# Patient Record
Sex: Female | Born: 1980 | Race: White | Hispanic: No | Marital: Married | Smoking: Never smoker
Health system: Southern US, Community
[De-identification: ages and names within clinical notes are randomized; demographics above are authoritative.]

## PROBLEM LIST (undated history)

## (undated) DIAGNOSIS — R519 Headache, unspecified: Secondary | ICD-10-CM

## (undated) DIAGNOSIS — Z8619 Personal history of other infectious and parasitic diseases: Secondary | ICD-10-CM

## (undated) DIAGNOSIS — D649 Anemia, unspecified: Secondary | ICD-10-CM

## (undated) DIAGNOSIS — R51 Headache: Secondary | ICD-10-CM

## (undated) DIAGNOSIS — F329 Major depressive disorder, single episode, unspecified: Secondary | ICD-10-CM

## (undated) DIAGNOSIS — F32A Depression, unspecified: Secondary | ICD-10-CM

## (undated) DIAGNOSIS — F419 Anxiety disorder, unspecified: Secondary | ICD-10-CM

## (undated) HISTORY — DX: Headache: R51

## (undated) HISTORY — DX: Headache, unspecified: R51.9

## (undated) HISTORY — DX: Anxiety disorder, unspecified: F41.9

## (undated) HISTORY — DX: Personal history of other infectious and parasitic diseases: Z86.19

## (undated) HISTORY — DX: Depression, unspecified: F32.A

## (undated) HISTORY — DX: Major depressive disorder, single episode, unspecified: F32.9

---

## 1999-02-27 ENCOUNTER — Ambulatory Visit (HOSPITAL_COMMUNITY): Admission: RE | Admit: 1999-02-27 | Discharge: 1999-02-27 | Payer: Self-pay | Admitting: Psychiatry

## 2010-04-03 ENCOUNTER — Emergency Department (HOSPITAL_COMMUNITY): Admission: EM | Admit: 2010-04-03 | Discharge: 2010-04-03 | Payer: Self-pay | Admitting: Emergency Medicine

## 2010-11-30 LAB — POCT PREGNANCY, URINE: Preg Test, Ur: NEGATIVE

## 2014-10-25 LAB — OB RESULTS CONSOLE ABO/RH: RH TYPE: NEGATIVE

## 2014-10-25 LAB — OB RESULTS CONSOLE RPR: RPR: NONREACTIVE

## 2014-10-25 LAB — OB RESULTS CONSOLE RUBELLA ANTIBODY, IGM: Rubella: NON-IMMUNE/NOT IMMUNE

## 2014-10-25 LAB — OB RESULTS CONSOLE HIV ANTIBODY (ROUTINE TESTING): HIV: NONREACTIVE

## 2014-10-25 LAB — OB RESULTS CONSOLE ANTIBODY SCREEN: ANTIBODY SCREEN: NEGATIVE

## 2014-10-25 LAB — OB RESULTS CONSOLE HEPATITIS B SURFACE ANTIGEN: HEP B S AG: NEGATIVE

## 2014-10-25 LAB — OB RESULTS CONSOLE GC/CHLAMYDIA
CHLAMYDIA, DNA PROBE: NEGATIVE
Gonorrhea: NEGATIVE

## 2015-03-16 ENCOUNTER — Telehealth: Payer: Self-pay | Admitting: Oncology

## 2015-03-16 NOTE — Telephone Encounter (Signed)
NEW PATIENT APPT-S/W PATIENT AND GAVE NP APPT FOR 07/19 @ 1:45 W/DR. SHADAD REFERRING DR. Aundra MilletMEGAN Fox DX-SYMPTOMATIC ANEMIA IN PREGNANCY

## 2015-04-03 ENCOUNTER — Ambulatory Visit: Payer: Self-pay | Admitting: Oncology

## 2015-04-03 ENCOUNTER — Ambulatory Visit: Payer: Self-pay

## 2015-05-01 LAB — OB RESULTS CONSOLE GBS: GBS: POSITIVE

## 2015-05-14 ENCOUNTER — Telehealth (HOSPITAL_COMMUNITY): Payer: Self-pay | Admitting: *Deleted

## 2015-05-14 ENCOUNTER — Encounter (HOSPITAL_COMMUNITY): Payer: Self-pay | Admitting: *Deleted

## 2015-05-14 NOTE — Telephone Encounter (Signed)
Preadmission screen  

## 2015-05-15 ENCOUNTER — Encounter (HOSPITAL_COMMUNITY): Payer: Self-pay | Admitting: *Deleted

## 2015-05-15 ENCOUNTER — Inpatient Hospital Stay (HOSPITAL_COMMUNITY)
Admission: AD | Admit: 2015-05-15 | Discharge: 2015-05-19 | DRG: 766 | Disposition: A | Discharge status: Home / Self Care | Payer: 59 | Source: Ambulatory Visit | Attending: Obstetrics and Gynecology | Admitting: Obstetrics and Gynecology

## 2015-05-15 DIAGNOSIS — O1493 Unspecified pre-eclampsia, third trimester: Secondary | ICD-10-CM | POA: Diagnosis present

## 2015-05-15 DIAGNOSIS — O09523 Supervision of elderly multigravida, third trimester: Secondary | ICD-10-CM | POA: Diagnosis not present

## 2015-05-15 DIAGNOSIS — Z8249 Family history of ischemic heart disease and other diseases of the circulatory system: Secondary | ICD-10-CM | POA: Diagnosis not present

## 2015-05-15 DIAGNOSIS — E669 Obesity, unspecified: Secondary | ICD-10-CM | POA: Diagnosis present

## 2015-05-15 DIAGNOSIS — O99214 Obesity complicating childbirth: Secondary | ICD-10-CM | POA: Diagnosis present

## 2015-05-15 DIAGNOSIS — Z3A37 37 weeks gestation of pregnancy: Secondary | ICD-10-CM | POA: Diagnosis present

## 2015-05-15 DIAGNOSIS — O99824 Streptococcus B carrier state complicating childbirth: Secondary | ICD-10-CM | POA: Diagnosis present

## 2015-05-15 DIAGNOSIS — O133 Gestational [pregnancy-induced] hypertension without significant proteinuria, third trimester: Secondary | ICD-10-CM | POA: Diagnosis present

## 2015-05-15 DIAGNOSIS — Z6836 Body mass index (BMI) 36.0-36.9, adult: Secondary | ICD-10-CM

## 2015-05-15 HISTORY — DX: Anemia, unspecified: D64.9

## 2015-05-15 LAB — COMPREHENSIVE METABOLIC PANEL
ALK PHOS: 89 U/L (ref 38–126)
ALT: 13 U/L — ABNORMAL LOW (ref 14–54)
ANION GAP: 10 (ref 5–15)
AST: 19 U/L (ref 15–41)
Albumin: 3 g/dL — ABNORMAL LOW (ref 3.5–5.0)
BUN: 12 mg/dL (ref 6–20)
CALCIUM: 8.8 mg/dL — AB (ref 8.9–10.3)
CO2: 21 mmol/L — AB (ref 22–32)
Chloride: 106 mmol/L (ref 101–111)
Creatinine, Ser: 0.6 mg/dL (ref 0.44–1.00)
Glucose, Bld: 80 mg/dL (ref 65–99)
Potassium: 3.6 mmol/L (ref 3.5–5.1)
SODIUM: 137 mmol/L (ref 135–145)
TOTAL PROTEIN: 6.3 g/dL — AB (ref 6.5–8.1)
Total Bilirubin: 0.5 mg/dL (ref 0.3–1.2)

## 2015-05-15 LAB — URIC ACID: URIC ACID, SERUM: 4.4 mg/dL (ref 2.3–6.6)

## 2015-05-15 LAB — CBC
HEMATOCRIT: 30.8 % — AB (ref 36.0–46.0)
Hemoglobin: 10.5 g/dL — ABNORMAL LOW (ref 12.0–15.0)
MCH: 30.2 pg (ref 26.0–34.0)
MCHC: 34.1 g/dL (ref 30.0–36.0)
MCV: 88.5 fL (ref 78.0–100.0)
PLATELETS: 258 10*3/uL (ref 150–400)
RBC: 3.48 MIL/uL — AB (ref 3.87–5.11)
RDW: 13.8 % (ref 11.5–15.5)
WBC: 9.7 10*3/uL (ref 4.0–10.5)

## 2015-05-15 MED ORDER — LACTATED RINGERS IV SOLN
INTRAVENOUS | Status: DC
  Administered 2015-05-15 – 2015-05-16 (×2): via INTRAVENOUS

## 2015-05-15 MED ORDER — ZOLPIDEM TARTRATE 5 MG PO TABS
5.0000 mg | ORAL_TABLET | Freq: Every evening | ORAL | Status: DC | PRN
  Administered 2015-05-16: 5 mg via ORAL
  Filled 2015-05-15: qty 1

## 2015-05-15 MED ORDER — LIDOCAINE HCL (PF) 1 % IJ SOLN: 30.0000 mL | INTRAMUSCULAR | Status: DC | PRN

## 2015-05-15 MED ORDER — FLEET ENEMA 7-19 GM/118ML RE ENEM: 1.0000 | ENEMA | RECTAL | Status: DC | PRN

## 2015-05-15 MED ORDER — OXYCODONE-ACETAMINOPHEN 5-325 MG PO TABS
2.0000 | ORAL_TABLET | ORAL | Status: DC | PRN
Start: 2015-05-15 — End: 2015-05-16

## 2015-05-15 MED ORDER — TERBUTALINE SULFATE 1 MG/ML IJ SOLN: 0.2500 mg | Freq: Once | INTRAMUSCULAR | Status: DC | PRN

## 2015-05-15 MED ORDER — PENICILLIN G POTASSIUM 5000000 UNITS IJ SOLR
5.0000 10*6.[IU] | Freq: Once | INTRAVENOUS | Status: AC
  Administered 2015-05-16: 5 10*6.[IU] via INTRAVENOUS
  Filled 2015-05-15: qty 5

## 2015-05-15 MED ORDER — PENICILLIN G POTASSIUM 5000000 UNITS IJ SOLR
2.5000 10*6.[IU] | INTRAVENOUS | Status: DC
  Administered 2015-05-16 (×4): 2.5 10*6.[IU] via INTRAVENOUS
  Filled 2015-05-15 (×11): qty 2.5

## 2015-05-15 MED ORDER — ONDANSETRON HCL 4 MG/2ML IJ SOLN: 4.0000 mg | Freq: Four times a day (QID) | INTRAMUSCULAR | Status: DC | PRN

## 2015-05-15 MED ORDER — OXYTOCIN BOLUS FROM INFUSION: 500.0000 mL | INTRAVENOUS | Status: DC

## 2015-05-15 MED ORDER — CITRIC ACID-SODIUM CITRATE 334-500 MG/5ML PO SOLN
30.0000 mL | ORAL | Status: DC | PRN
  Administered 2015-05-16: 30 mL via ORAL
  Filled 2015-05-15: qty 15

## 2015-05-15 MED ORDER — OXYTOCIN 40 UNITS IN LACTATED RINGERS INFUSION - SIMPLE MED
62.5000 mL/h | INTRAVENOUS | Status: DC
  Filled 2015-05-15: qty 1000

## 2015-05-15 MED ORDER — OXYCODONE-ACETAMINOPHEN 5-325 MG PO TABS: 1.0000 | ORAL_TABLET | ORAL | Status: DC | PRN

## 2015-05-15 MED ORDER — MISOPROSTOL 25 MCG QUARTER TABLET
25.0000 ug | ORAL_TABLET | ORAL | Status: DC | PRN
  Administered 2015-05-15 – 2015-05-16 (×2): 25 ug via VAGINAL
  Filled 2015-05-15 (×2): qty 0.25

## 2015-05-15 MED ORDER — ACETAMINOPHEN 325 MG PO TABS: 650.0000 mg | ORAL_TABLET | ORAL | Status: DC | PRN

## 2015-05-15 MED ORDER — LACTATED RINGERS IV SOLN
500.0000 mL | INTRAVENOUS | Status: DC | PRN
  Administered 2015-05-16 (×2): 500 mL via INTRAVENOUS

## 2015-05-15 NOTE — H&P (Signed)
Angelica Fox is a 34 y.o. female presenting for IOL for preeclampsia per Dr Rana Snare. Patient has had very labile BPs in office. Maternal Medical History:  Fetal activity: Perceived fetal activity is normal.      OB History    Gravida Para Term Preterm AB TAB SAB Ectopic Multiple Living   3 0   Past Medical History  Diagnosis Date  . Hx of varicella   . Headache   . Anxiety   . Depression    History reviewed. No pertinent past surgical history. Family History: family history includes Cancer in her mother; Heart disease in her paternal grandfather and paternal grandmother. Social History:  reports that she has never smoked. She has never used smokeless tobacco. She reports that she does not drink alcohol or use illicit drugs.   Prenatal Transfer Tool  Maternal Diabetes: No Genetic Screening: Normal Maternal Ultrasounds/Referrals: Normal Fetal Ultrasounds or other Referrals:  None Maternal Substance Abuse:  No Significant Maternal Medications:  None Significant Maternal Lab Results:  None Other Comments:  None  Review of Systems  Eyes: Negative for blurred vision.  Gastrointestinal: Negative for abdominal pain.  Neurological: Negative for headaches.    Dilation: Closed Effacement (%): Thick Station: -3 Exam by:: Orton Capell Blood pressure 119/71, pulse 82, temperature 98.1 F (36.7 C), temperature source Oral, resp. rate 18, height  (1.575 m), weight 200 lb (90.719 kg), last menstrual period 08/23/2014. Maternal Exam:  Abdomen: Patient reports no abdominal tenderness. Fetal presentation: vertex     Fetal Exam Fetal State Assessment: Category I - tracings are normal.     Physical Exam  Cardiovascular: Normal rate and regular rhythm.   Respiratory: Effort normal and breath sounds normal.  GI: Soft. Bowel sounds are normal.  Neurological: She has normal reflexes.    Prenatal labs: ABO, Rh: --/--/O NEG (08/30 1915) Antibody: POS (08/30  1915) Rubella: Nonimmune (02/10 0000) RPR: Nonreactive (02/10 0000)  HBsAg: Negative (02/10 0000)  HIV: Non-reactive (02/10 0000)  GBS: Positive (08/16 0000)   Assessment/Plan: 34 yo @ 37 6/7 weeks with labile BPs D/W patient two stage induction and risks including fetal distress and emergency cesarean section as well as failed induction   Angelica Fox,Angelica Fox 05/15/2015, 11:24 PM

## 2015-05-16 ENCOUNTER — Inpatient Hospital Stay (HOSPITAL_COMMUNITY): Payer: 59 | Admitting: Anesthesiology

## 2015-05-16 ENCOUNTER — Encounter (HOSPITAL_COMMUNITY)
Admission: AD | Disposition: A | Discharge status: Home / Self Care | Payer: Self-pay | Source: Ambulatory Visit | Attending: Obstetrics and Gynecology

## 2015-05-16 LAB — CBC
HCT: 32.4 % — ABNORMAL LOW (ref 36.0–46.0)
HCT: 29.3 % — ABNORMAL LOW (ref 36.0–46.0)
HEMOGLOBIN: 11 g/dL — AB (ref 12.0–15.0)
HEMOGLOBIN: 10 g/dL — AB (ref 12.0–15.0)
MCH: 30 pg (ref 26.0–34.0)
MCH: 30.1 pg (ref 26.0–34.0)
MCHC: 34 g/dL (ref 30.0–36.0)
MCHC: 34.1 g/dL (ref 30.0–36.0)
MCV: 88.3 fL (ref 78.0–100.0)
MCV: 88.3 fL (ref 78.0–100.0)
PLATELETS: 255 10*3/uL (ref 150–400)
PLATELETS: 228 10*3/uL (ref 150–400)
RBC: 3.67 MIL/uL — AB (ref 3.87–5.11)
RBC: 3.32 MIL/uL — AB (ref 3.87–5.11)
RDW: 13.8 % (ref 11.5–15.5)
RDW: 13.9 % (ref 11.5–15.5)
WBC: 11.6 10*3/uL — AB (ref 4.0–10.5)
WBC: 16.1 10*3/uL — ABNORMAL HIGH (ref 4.0–10.5)

## 2015-05-16 LAB — RPR: RPR Ser Ql: NONREACTIVE

## 2015-05-16 SURGERY — Surgical Case
Anesthesia: Epidural | Site: Abdomen

## 2015-05-16 MED ORDER — NALBUPHINE HCL 10 MG/ML IJ SOLN
5.0000 mg | INTRAMUSCULAR | Status: DC | PRN
  Filled 2015-05-16: qty 0.5

## 2015-05-16 MED ORDER — KETOROLAC TROMETHAMINE 30 MG/ML IJ SOLN
30.0000 mg | Freq: Four times a day (QID) | INTRAMUSCULAR | Status: AC | PRN
  Administered 2015-05-16 – 2015-05-17 (×2): 30 mg via INTRAVENOUS
  Filled 2015-05-16: qty 1

## 2015-05-16 MED ORDER — SODIUM BICARBONATE 8.4 % IV SOLN
INTRAVENOUS | Status: DC | PRN
  Administered 2015-05-16 (×3): 5 mL via EPIDURAL

## 2015-05-16 MED ORDER — LACTATED RINGERS IV SOLN
INTRAVENOUS | Status: DC | PRN
  Administered 2015-05-16: 21:00:00 via INTRAVENOUS

## 2015-05-16 MED ORDER — PROMETHAZINE HCL 25 MG/ML IJ SOLN
INTRAMUSCULAR | Status: AC
Start: 2015-05-16 — End: 2015-05-17
  Filled 2015-05-16: qty 1

## 2015-05-16 MED ORDER — DEXAMETHASONE SODIUM PHOSPHATE 4 MG/ML IJ SOLN
INTRAMUSCULAR | Status: AC
  Filled 2015-05-16: qty 1

## 2015-05-16 MED ORDER — ONDANSETRON HCL 4 MG/2ML IJ SOLN
INTRAMUSCULAR | Status: DC | PRN
  Administered 2015-05-16: 4 mg via INTRAVENOUS

## 2015-05-16 MED ORDER — DEXAMETHASONE SODIUM PHOSPHATE 4 MG/ML IJ SOLN
INTRAMUSCULAR | Status: DC | PRN
  Administered 2015-05-16: 4 mg via INTRAVENOUS

## 2015-05-16 MED ORDER — PHENYLEPHRINE 40 MCG/ML (10ML) SYRINGE FOR IV PUSH (FOR BLOOD PRESSURE SUPPORT)
80.0000 ug | PREFILLED_SYRINGE | INTRAVENOUS | Status: DC | PRN
  Filled 2015-05-16: qty 20

## 2015-05-16 MED ORDER — LIDOCAINE HCL (PF) 1 % IJ SOLN
INTRAMUSCULAR | Status: DC | PRN
  Administered 2015-05-16 (×2): 4 mL

## 2015-05-16 MED ORDER — SCOPOLAMINE 1 MG/3DAYS TD PT72
MEDICATED_PATCH | TRANSDERMAL | Status: AC
  Filled 2015-05-16: qty 1

## 2015-05-16 MED ORDER — CEFOTETAN DISODIUM 2 G IJ SOLR
2.0000 g | Freq: Two times a day (BID) | INTRAMUSCULAR | Status: DC
  Administered 2015-05-16: 2 g via INTRAVENOUS
  Filled 2015-05-16 (×2): qty 2

## 2015-05-16 MED ORDER — OXYTOCIN 10 UNIT/ML IJ SOLN
INTRAMUSCULAR | Status: AC
  Filled 2015-05-16: qty 4

## 2015-05-16 MED ORDER — MEPERIDINE HCL 25 MG/ML IJ SOLN
INTRAMUSCULAR | Status: AC
  Filled 2015-05-16: qty 1

## 2015-05-16 MED ORDER — OXYTOCIN 10 UNIT/ML IJ SOLN
40.0000 [IU] | INTRAVENOUS | Status: DC | PRN
  Administered 2015-05-16: 40 [IU] via INTRAVENOUS

## 2015-05-16 MED ORDER — SCOPOLAMINE 1 MG/3DAYS TD PT72
MEDICATED_PATCH | TRANSDERMAL | Status: DC | PRN
  Administered 2015-05-16: 1 via TRANSDERMAL

## 2015-05-16 MED ORDER — TERBUTALINE SULFATE 1 MG/ML IJ SOLN: 0.2500 mg | Freq: Once | INTRAMUSCULAR | Status: DC | PRN

## 2015-05-16 MED ORDER — KETOROLAC TROMETHAMINE 30 MG/ML IJ SOLN
INTRAMUSCULAR | Status: AC
Start: 2015-05-16 — End: 2015-05-17
  Filled 2015-05-16: qty 1

## 2015-05-16 MED ORDER — DIPHENHYDRAMINE HCL 50 MG/ML IJ SOLN: 12.5000 mg | INTRAMUSCULAR | Status: DC | PRN

## 2015-05-16 MED ORDER — PROMETHAZINE HCL 25 MG/ML IJ SOLN
INTRAMUSCULAR | Status: AC
  Filled 2015-05-16: qty 1

## 2015-05-16 MED ORDER — ONDANSETRON HCL 4 MG/2ML IJ SOLN
INTRAMUSCULAR | Status: AC
  Filled 2015-05-16: qty 2

## 2015-05-16 MED ORDER — NALBUPHINE HCL 10 MG/ML IJ SOLN
INTRAMUSCULAR | Status: AC
  Filled 2015-05-16: qty 1

## 2015-05-16 MED ORDER — BUTORPHANOL TARTRATE 1 MG/ML IJ SOLN
1.0000 mg | INTRAMUSCULAR | Status: DC | PRN
  Administered 2015-05-16 (×2): 1 mg via INTRAVENOUS
  Filled 2015-05-16 (×2): qty 1

## 2015-05-16 MED ORDER — FENTANYL 2.5 MCG/ML BUPIVACAINE 1/10 % EPIDURAL INFUSION (WH - ANES)
14.0000 mL/h | INTRAMUSCULAR | Status: DC | PRN
  Administered 2015-05-16 (×2): 14 mL/h via EPIDURAL
  Filled 2015-05-16 (×2): qty 125

## 2015-05-16 MED ORDER — SODIUM CHLORIDE 0.9 % IR SOLN
Status: DC | PRN
Start: 2015-05-16 — End: 2015-05-16
  Administered 2015-05-16: 1000 mL

## 2015-05-16 MED ORDER — MORPHINE SULFATE 0.5 MG/ML IJ SOLN
INTRAMUSCULAR | Status: AC
  Filled 2015-05-16: qty 100

## 2015-05-16 MED ORDER — MEPERIDINE HCL 25 MG/ML IJ SOLN: 6.2500 mg | INTRAMUSCULAR | Status: DC | PRN

## 2015-05-16 MED ORDER — NALBUPHINE HCL 10 MG/ML IJ SOLN
5.0000 mg | Freq: Once | INTRAMUSCULAR | Status: AC | PRN
  Administered 2015-05-16: 5 mg via INTRAVENOUS

## 2015-05-16 MED ORDER — ONDANSETRON HCL 4 MG/2ML IJ SOLN: 4.0000 mg | Freq: Once | INTRAMUSCULAR | Status: DC | PRN

## 2015-05-16 MED ORDER — EPHEDRINE 5 MG/ML INJ: 10.0000 mg | INTRAVENOUS | Status: DC | PRN

## 2015-05-16 MED ORDER — KETOROLAC TROMETHAMINE 30 MG/ML IJ SOLN
INTRAMUSCULAR | Status: AC
  Filled 2015-05-16: qty 1

## 2015-05-16 MED ORDER — FENTANYL CITRATE (PF) 100 MCG/2ML IJ SOLN: 25.0000 ug | INTRAMUSCULAR | Status: DC | PRN

## 2015-05-16 MED ORDER — PROMETHAZINE HCL 25 MG/ML IJ SOLN
INTRAMUSCULAR | Status: DC | PRN
  Administered 2015-05-16 (×2): 6.25 mg via INTRAVENOUS

## 2015-05-16 MED ORDER — MORPHINE SULFATE (PF) 0.5 MG/ML IJ SOLN
INTRAMUSCULAR | Status: DC | PRN
  Administered 2015-05-16: 4 mg via EPIDURAL
  Administered 2015-05-16: 1 mg via INTRAVENOUS

## 2015-05-16 MED ORDER — KETOROLAC TROMETHAMINE 30 MG/ML IJ SOLN
30.0000 mg | Freq: Four times a day (QID) | INTRAMUSCULAR | Status: AC | PRN
  Administered 2015-05-16: 30 mg via INTRAMUSCULAR

## 2015-05-16 MED ORDER — PROMETHAZINE HCL 25 MG/ML IJ SOLN: 12.5000 mg | INTRAMUSCULAR | Status: DC | PRN

## 2015-05-16 MED ORDER — OXYTOCIN 40 UNITS IN LACTATED RINGERS INFUSION - SIMPLE MED
1.0000 m[IU]/min | INTRAVENOUS | Status: DC
  Administered 2015-05-16: 12 m[IU]/min via INTRAVENOUS
  Administered 2015-05-16: 2 m[IU]/min via INTRAVENOUS

## 2015-05-16 MED ORDER — NALBUPHINE HCL 10 MG/ML IJ SOLN: 5.0000 mg | Freq: Once | INTRAMUSCULAR | Status: AC | PRN

## 2015-05-16 MED ORDER — MEPERIDINE HCL 25 MG/ML IJ SOLN
INTRAMUSCULAR | Status: DC | PRN
  Administered 2015-05-16: 12.5 mg via INTRAVENOUS

## 2015-05-16 SURGICAL SUPPLY — 33 items
CLAMP CORD UMBIL (MISCELLANEOUS) IMPLANT
CLOTH BEACON ORANGE TIMEOUT ST (SAFETY) ×2 IMPLANT
DRAPE SHEET LG 3/4 BI-LAMINATE (DRAPES) IMPLANT
DRSG OPSITE POSTOP 4X10 (GAUZE/BANDAGES/DRESSINGS) ×2 IMPLANT
DURAPREP 26ML APPLICATOR (WOUND CARE) ×2 IMPLANT
ELECT REM PT RETURN 9FT ADLT (ELECTROSURGICAL) ×2
ELECTRODE REM PT RTRN 9FT ADLT (ELECTROSURGICAL) ×1 IMPLANT
EXTRACTOR VACUUM M CUP 4 TUBE (SUCTIONS) IMPLANT
GLOVE BIO SURGEON STRL SZ 6.5 (GLOVE) ×1 IMPLANT
GLOVE BIOGEL PI IND STRL 6.5 (GLOVE) IMPLANT
GLOVE BIOGEL PI IND STRL 7.0 (GLOVE) IMPLANT
GLOVE BIOGEL PI INDICATOR 6.5 (GLOVE) ×1
GLOVE BIOGEL PI INDICATOR 7.0 (GLOVE) ×1
GLOVE SURG ORTHO 8.0 STRL STRW (GLOVE) ×2 IMPLANT
GOWN STRL REUS W/TWL LRG LVL3 (GOWN DISPOSABLE) ×4 IMPLANT
KIT ABG SYR 3ML LUER SLIP (SYRINGE) ×2 IMPLANT
NDL HYPO 25X5/8 SAFETYGLIDE (NEEDLE) ×1 IMPLANT
NEEDLE HYPO 25X5/8 SAFETYGLIDE (NEEDLE) ×2 IMPLANT
NS IRRIG 1000ML POUR BTL (IV SOLUTION) ×2 IMPLANT
PACK C SECTION WH (CUSTOM PROCEDURE TRAY) ×2 IMPLANT
PAD OB MATERNITY 4.3X12.25 (PERSONAL CARE ITEMS) ×2 IMPLANT
PAD TRENDEL OR TABLE W/STRAP (MISCELLANEOUS) ×2 IMPLANT
PAD TRENDELENBURG OR TABLE (MISCELLANEOUS) ×2 IMPLANT
PENCIL SMOKE EVAC W/HOLSTER (ELECTROSURGICAL) ×2 IMPLANT
SUT MNCRL 0 VIOLET CTX 36 (SUTURE) ×3 IMPLANT
SUT MON AB 4-0 PS1 27 (SUTURE) ×2 IMPLANT
SUT MONOCRYL 0 CTX 36 (SUTURE) ×3
SUT PDS AB 1 CT  36 (SUTURE) ×1
SUT PDS AB 1 CT 36 (SUTURE) IMPLANT
SUT VIC AB 1 CTX 36 (SUTURE)
SUT VIC AB 1 CTX36XBRD ANBCTRL (SUTURE) IMPLANT
TOWEL OR 17X24 6PK STRL BLUE (TOWEL DISPOSABLE) ×2 IMPLANT
TRAY FOLEY CATH SILVER 14FR (SET/KITS/TRAYS/PACK) ×2 IMPLANT

## 2015-05-16 NOTE — Anesthesia Procedure Notes (Signed)
Epidural Patient location during procedure: OB  Staffing Anesthesiologist: Jesiah Yerby Performed by: anesthesiologist   Preanesthetic Checklist Completed: patient identified, site marked, surgical consent, pre-op evaluation, timeout performed, IV checked, risks and benefits discussed and monitors and equipment checked  Epidural Patient position: sitting Prep: site prepped and draped and DuraPrep Patient monitoring: continuous pulse ox and blood pressure Approach: midline Location: L3-L4 Injection technique: LOR air  Needle:  Needle type: Tuohy  Needle gauge: 17 G Needle length: 9 cm and 9 Needle insertion depth: 6.5 cm Catheter type: closed end flexible Catheter size: 19 Gauge Catheter at skin depth: 11 cm Test dose: negative  Assessment Events: blood not aspirated, injection not painful, no injection resistance, negative IV test and no paresthesia  Additional Notes Patient identified. Risks/Benefits/Options discussed with patient including but not limited to bleeding, infection, nerve damage, paralysis, failed block, incomplete pain control, headache, blood pressure changes, nausea, vomiting, reactions to medication both or allergic, itching and postpartum back pain. Confirmed with bedside nurse the patient's most recent platelet count. Confirmed with patient that they are not currently taking any anticoagulation, have any bleeding history or any family history of bleeding disorders. Patient expressed understanding and wished to proceed. All questions were answered. Sterile technique was used throughout the entire procedure. Please see nursing notes for vital signs. Test dose was given through epidural catheter and negative prior to continuing to dose epidural or start infusion. Warning signs of high block given to the patient including shortness of breath, tingling/numbness in hands, complete motor block, or any concerning symptoms with instructions to call for help. Patient was  given instructions on fall risk and not to get out of bed. All questions and concerns addressed with instructions to call with any issues or inadequate analgesia.

## 2015-05-16 NOTE — Anesthesia Postprocedure Evaluation (Signed)
  Anesthesia Post-op Note  Patient: Angelica Fox  Procedure(s) Performed: Procedure(s) (LRB): CESAREAN SECTION (N/A)  Patient Location: PACU  Anesthesia Type: Epidural  Level of Consciousness: awake and alert   Airway and Oxygen Therapy: Patient Spontanous Breathing  Post-op Pain: mild  Post-op Assessment: Post-op Vital signs reviewed, Patient's Cardiovascular Status Stable, Respiratory Function Stable, Patent Airway and No signs of Nausea or vomiting  Last Vitals:  Filed Vitals:   05/16/15 2210  BP:   Pulse: 64  Temp:   Resp: 15    Post-op Vital Signs: stable   Complications: No apparent anesthesia complications

## 2015-05-16 NOTE — Progress Notes (Signed)
Patient ID: Angelica Fox, female   DOB: [birth year], 34 y.o.   MRN: [id number] Pt admitted last night for gestational HTN on Mod BR No Severe features. Pt now with moderate ctxs and SROM clear fluid at 0330  VS  BPs now 110-130/70-80s FHR 140s Cat 1 Ctxs q 3-5  Cx Per RN Ch/th and gross rupture  DTRs 2/4  Gest HTN at term Stable SROM - IV Abx for GBS Will augment with pitocin.  anticpate SVD

## 2015-05-16 NOTE — Op Note (Signed)
Cesarean Section Procedure Note  Pre-operative Diagnosis: IUP at 38 weeks, Gestational HTN, failure to progress  Post-operative Diagnosis: same + OP presentation  Surgeon: Aiyannah Fayad C   Assistants:  none  Anesthesia: epidural  Procedure:  Low Segment Transverse cesarean section  Procedure Details  The patient was seen in the Holding Room. The risks, benefits, complications, treatment options, and expected outcomes were discussed with the patient.  The patient concurred with the proposed plan, giving informed consent.  The site of surgery properly noted/marked.. A Time Out was held and the above information confirmed.  After induction of anesthesia, the patient was draped and prepped in the usual sterile manner. A Pfannenstiel incision was made and carried down through the subcutaneous tissue to the fascia. Fascial incision was made and extended transversely. The fascia was separated from the underlying rectus tissue superiorly and inferiorly. The peritoneum was identified and entered. Peritoneal incision was extended longitudinally. The utero-vesical peritoneal reflection was incised transversely and the bladder flap was bluntly freed from the lower uterine segment. A low transverse uterine incision was made. Delivered from vertex presentation was a baby with Apgar scores of 8 at one minute and 9 at five minutes. After the umbilical cord was clamped and cut cord blood was obtained for evaluation. The placenta was removed intact and appeared normal. The uterine outline, tubes and ovaries appeared normal. The uterine incision was closed with running locked sutures of 0 monocryl and imbricated with 0 monocryl. Hemostasis was observed. Lavage was carried out until clear. The peritoneum was then closed with 0 monocryl and rectus muscles plicated in the midline.  After hemostasis was assured, the fascia was then reapproximated with running sutures of #1 PDS. Irrigation was applied and after adequate  hemostasis was assured, the skin was reapproximated with subcutaneous sutures using 4-0 monocryl.  Instrument, sponge, and needle counts were correct prior the abdominal closure and at the conclusion of the case. The patient received 2 grams cefotetan preoperatively.  Findings: Viable female  Estimated Blood Loss:  600cc         Specimens: Placenta was sent to labor and delivery         Complications:  None

## 2015-05-16 NOTE — Anesthesia Preprocedure Evaluation (Addendum)
Anesthesia Evaluation  Patient identified by MRN, date of birth, ID band Patient awake    Reviewed: Allergy & Precautions, NPO status , Patient's Chart, lab work & pertinent test results  History of Anesthesia Complications Negative for: history of anesthetic complications  Airway Mallampati: II  TM Distance: >3 FB Neck ROM: Full    Dental no notable dental hx. (+) Dental Advisory Given   Pulmonary neg pulmonary ROS,  breath sounds clear to auscultation  Pulmonary exam normal       Cardiovascular negative cardio ROS Normal cardiovascular examRhythm:Regular Rate:Normal     Neuro/Psych  Headaches, PSYCHIATRIC DISORDERS Anxiety Depression    GI/Hepatic negative GI ROS, Neg liver ROS,   Endo/Other  obesity  Renal/GU negative Renal ROS  negative genitourinary   Musculoskeletal negative musculoskeletal ROS (+)   Abdominal   Peds negative pediatric ROS (+)  Hematology negative hematology ROS (+)   Anesthesia Other Findings   Reproductive/Obstetrics (+) Pregnancy                            Anesthesia Physical Anesthesia Plan  ASA: II  Anesthesia Plan: Epidural   Post-op Pain Management:    Induction:   Airway Management Planned:   Additional Equipment:   Intra-op Plan:   Post-operative Plan:   Informed Consent: I have reviewed the patients History and Physical, chart, labs and discussed the procedure including the risks, benefits and alternatives for the proposed anesthesia with the patient or authorized representative who has indicated his/her understanding and acceptance.     Plan Discussed with:   Anesthesia Plan Comments:         Anesthesia Quick Evaluation

## 2015-05-16 NOTE — Transfer of Care (Signed)
Immediate Anesthesia Transfer of Care Note  Patient: Angelica Fox  Procedure(s) Performed: Procedure(s): CESAREAN SECTION (N/A)  Patient Location: PACU  Anesthesia Type:Epidural  Level of Consciousness: awake, alert , oriented and patient cooperative  Airway & Oxygen Therapy: Patient Spontanous Breathing  Post-op Assessment: Report given to RN and Post -op Vital signs reviewed and stable  Post vital signs: Reviewed and stable  Last Vitals:  Filed Vitals:   05/16/15 2026  BP: 122/90  Pulse: 75  Temp:   Resp:     Complications: No apparent anesthesia complications

## 2015-05-16 NOTE — Progress Notes (Signed)
Patient ID: Angelica Fox, female   DOB: December 25, 1980, 34 y.o.   MRN: [id number] Pt comfortable with epidural FHR now 140s Cat 1  Previously with mod variables and has had one prolonged bradycardia which recovered with position change  Ctxs 300+MVU  Cx 3/80/-2 (no change since earlier exams per my exam)  I had a lengthy discussion with the patient and her family regarding the protracted latent labor, and the variable decels. At this time they want to proceed with Primary C/S For FTP.Risks and benefits of C/S were discussed. All questions were answered and informed consent was obtained. Plan to proceed with low segment transverse Cesarean Section.

## 2015-05-17 ENCOUNTER — Encounter (HOSPITAL_COMMUNITY): Payer: Self-pay | Admitting: *Deleted

## 2015-05-17 LAB — CBC
HEMATOCRIT: 28.1 % — AB (ref 36.0–46.0)
Hemoglobin: 9.4 g/dL — ABNORMAL LOW (ref 12.0–15.0)
MCH: 29.6 pg (ref 26.0–34.0)
MCHC: 33.5 g/dL (ref 30.0–36.0)
MCV: 88.4 fL (ref 78.0–100.0)
PLATELETS: 247 10*3/uL (ref 150–400)
RBC: 3.18 MIL/uL — ABNORMAL LOW (ref 3.87–5.11)
RDW: 13.6 % (ref 11.5–15.5)
WBC: 16.2 10*3/uL — ABNORMAL HIGH (ref 4.0–10.5)

## 2015-05-17 MED ORDER — DIBUCAINE 1 % RE OINT: 1.0000 "application " | TOPICAL_OINTMENT | RECTAL | Status: DC | PRN

## 2015-05-17 MED ORDER — RHO D IMMUNE GLOBULIN 1500 UNIT/2ML IJ SOSY
300.0000 ug | PREFILLED_SYRINGE | Freq: Once | INTRAMUSCULAR | Status: AC
  Administered 2015-05-17: 300 ug via INTRAVENOUS
  Filled 2015-05-17: qty 2

## 2015-05-17 MED ORDER — OXYCODONE-ACETAMINOPHEN 5-325 MG PO TABS
1.0000 | ORAL_TABLET | ORAL | Status: DC | PRN
  Administered 2015-05-17 – 2015-05-19 (×8): 1 via ORAL
  Filled 2015-05-17 (×7): qty 1

## 2015-05-17 MED ORDER — OXYTOCIN 40 UNITS IN LACTATED RINGERS INFUSION - SIMPLE MED
62.5000 mL/h | INTRAVENOUS | Status: AC
Start: 2015-05-17 — End: 2015-05-17
  Administered 2015-05-16: 62.5 mL/h via INTRAVENOUS

## 2015-05-17 MED ORDER — NALOXONE HCL 1 MG/ML IJ SOLN
1.0000 ug/kg/h | INTRAVENOUS | Status: DC | PRN
  Filled 2015-05-17: qty 2

## 2015-05-17 MED ORDER — LANOLIN HYDROUS EX OINT: 1.0000 "application " | TOPICAL_OINTMENT | CUTANEOUS | Status: DC | PRN

## 2015-05-17 MED ORDER — ACETAMINOPHEN 325 MG PO TABS: 650.0000 mg | ORAL_TABLET | ORAL | Status: DC | PRN

## 2015-05-17 MED ORDER — DIPHENHYDRAMINE HCL 50 MG/ML IJ SOLN: 12.5000 mg | INTRAMUSCULAR | Status: DC | PRN

## 2015-05-17 MED ORDER — NALOXONE HCL 0.4 MG/ML IJ SOLN: 0.4000 mg | INTRAMUSCULAR | Status: DC | PRN

## 2015-05-17 MED ORDER — SIMETHICONE 80 MG PO CHEW
80.0000 mg | CHEWABLE_TABLET | Freq: Three times a day (TID) | ORAL | Status: DC
  Administered 2015-05-17 – 2015-05-19 (×6): 80 mg via ORAL
  Filled 2015-05-17 (×6): qty 1

## 2015-05-17 MED ORDER — SIMETHICONE 80 MG PO CHEW
80.0000 mg | CHEWABLE_TABLET | ORAL | Status: DC
  Administered 2015-05-17 – 2015-05-18 (×2): 80 mg via ORAL
  Filled 2015-05-17 (×2): qty 1

## 2015-05-17 MED ORDER — SCOPOLAMINE 1 MG/3DAYS TD PT72
1.0000 | MEDICATED_PATCH | Freq: Once | TRANSDERMAL | Status: DC
  Filled 2015-05-17: qty 1

## 2015-05-17 MED ORDER — ONDANSETRON HCL 4 MG/2ML IJ SOLN: 4.0000 mg | Freq: Three times a day (TID) | INTRAMUSCULAR | Status: DC | PRN

## 2015-05-17 MED ORDER — MENTHOL 3 MG MT LOZG: 1.0000 | LOZENGE | OROMUCOSAL | Status: DC | PRN

## 2015-05-17 MED ORDER — PRENATAL MULTIVITAMIN CH
1.0000 | ORAL_TABLET | Freq: Every day | ORAL | Status: DC
  Administered 2015-05-17 – 2015-05-19 (×3): 1 via ORAL
  Filled 2015-05-17 (×3): qty 1

## 2015-05-17 MED ORDER — TETANUS-DIPHTH-ACELL PERTUSSIS 5-2.5-18.5 LF-MCG/0.5 IM SUSP: 0.5000 mL | Freq: Once | INTRAMUSCULAR | Status: DC

## 2015-05-17 MED ORDER — ZOLPIDEM TARTRATE 5 MG PO TABS: 5.0000 mg | ORAL_TABLET | Freq: Every evening | ORAL | Status: DC | PRN

## 2015-05-17 MED ORDER — WITCH HAZEL-GLYCERIN EX PADS: 1.0000 "application " | MEDICATED_PAD | CUTANEOUS | Status: DC | PRN

## 2015-05-17 MED ORDER — SIMETHICONE 80 MG PO CHEW: 80.0000 mg | CHEWABLE_TABLET | ORAL | Status: DC | PRN

## 2015-05-17 MED ORDER — SODIUM CHLORIDE 0.9 % IJ SOLN: 3.0000 mL | INTRAMUSCULAR | Status: DC | PRN

## 2015-05-17 MED ORDER — LACTATED RINGERS IV SOLN
INTRAVENOUS | Status: DC
  Administered 2015-05-17: 08:00:00 via INTRAVENOUS

## 2015-05-17 MED ORDER — OXYCODONE-ACETAMINOPHEN 5-325 MG PO TABS
2.0000 | ORAL_TABLET | ORAL | Status: DC | PRN
Start: 2015-05-17 — End: 2015-05-19
  Administered 2015-05-18: 2 via ORAL
  Filled 2015-05-17 (×2): qty 2

## 2015-05-17 MED ORDER — IBUPROFEN 600 MG PO TABS
600.0000 mg | ORAL_TABLET | Freq: Four times a day (QID) | ORAL | Status: DC
  Administered 2015-05-17 – 2015-05-19 (×8): 600 mg via ORAL
  Filled 2015-05-17 (×8): qty 1

## 2015-05-17 MED ORDER — DIPHENHYDRAMINE HCL 25 MG PO CAPS: 25.0000 mg | ORAL_CAPSULE | ORAL | Status: DC | PRN

## 2015-05-17 MED ORDER — DIPHENHYDRAMINE HCL 25 MG PO CAPS: 25.0000 mg | ORAL_CAPSULE | Freq: Four times a day (QID) | ORAL | Status: DC | PRN

## 2015-05-17 MED ORDER — SENNOSIDES-DOCUSATE SODIUM 8.6-50 MG PO TABS
2.0000 | ORAL_TABLET | ORAL | Status: DC
  Administered 2015-05-17: 2 via ORAL
  Filled 2015-05-17: qty 2

## 2015-05-17 NOTE — Anesthesia Postprocedure Evaluation (Signed)
  Anesthesia Post-op Note  Patient: Angelica Fox  Procedure(s) Performed: Procedure(s): CESAREAN SECTION (N/A)  Patient Location: Mother/Baby  Anesthesia Type:Epidural  Level of Consciousness: awake  Airway and Oxygen Therapy: Patient Spontanous Breathing  Post-op Pain: mild  Post-op Assessment: Patient's Cardiovascular Status Stable and Respiratory Function Stable LLE Motor Response: Purposeful movement LLE Sensation: Increased RLE Motor Response: Purposeful movement RLE Sensation: Increased L Sensory Level: L3-Anterior knee, lower leg R Sensory Level: L3-Anterior knee, lower leg  Post-op Vital Signs: stable  Last Vitals:  Filed Vitals:   05/17/15 0556  BP: 118/72  Pulse: 67  Temp: 36.8 C  Resp: 18    Complications: No apparent anesthesia complications

## 2015-05-17 NOTE — Progress Notes (Signed)
Subjective: Postpartum Day 1: Cesarean Delivery Patient reports tolerating PO.    Objective: Vital signs in last 24 hours: Temp:  [97.8 F (36.6 C)-98.6 F (37 C)] 98.2 F (36.8 C) (09/01 0556) Pulse Rate:  [61-129] 67 (09/01 0556) Resp:  [10-21] 18 (09/01 0556) BP: (91-144)/(61-98) 118/72 mmHg (09/01 0556) SpO2:  [96 %-100 %] 98 % (09/01 0556)  Physical Exam:  General: alert and cooperative Lochia: appropriate Uterine Fundus: firm Incision: healing well DVT Evaluation: No evidence of DVT seen on physical exam. Negative Homan's sign. No cords or calf tenderness. Calf/Ankle edema is present.   Recent Labs  05/16/15 2138 05/17/15 0525  HGB 10.0* 9.4*  HCT 29.3* 28.1*    Assessment/Plan: Status post Cesarean section. Doing well postoperatively.  Continue current care.  CURTIS,CAROL G 05/17/2015, 8:22 AM

## 2015-05-17 NOTE — Lactation Note (Signed)
This note was copied from the chart of Angelica Fox. Lactation Consultation Note  Mom reports that BF is going well but LS is a 5 and BF are limited.  Baby is also receiving formula.  Mom had visitors and requested LC come back later or tomorrow.  Attempt will be made to follow-up this evening. Has handouts on support groups and outpatient services. Patient Name: Angelica Anahit Klumb [id number] Date: 05/17/2015     Maternal Data    Feeding    LATCH Score/Interventions                      Lactation Tools Discussed/Used     Consult Status      Soyla Dryer 05/17/2015, 4:48 PM

## 2015-05-17 NOTE — Addendum Note (Signed)
Addendum  created 05/17/15 0730 by Renford Dills, CRNA   Modules edited: Notes Section   Notes Section:  File: [id number]

## 2015-05-18 LAB — RH IG WORKUP (INCLUDES ABO/RH)
ABO/RH(D): O NEG
Fetal Screen: NEGATIVE
Gestational Age(Wks): 38
UNIT DIVISION: 0

## 2015-05-18 MED ORDER — MEASLES, MUMPS & RUBELLA VAC ~~LOC~~ INJ
0.5000 mL | INJECTION | Freq: Once | SUBCUTANEOUS | Status: AC
  Administered 2015-05-19: 0.5 mL via SUBCUTANEOUS
  Filled 2015-05-18 (×3): qty 0.5

## 2015-05-18 NOTE — Lactation Note (Signed)
This note was copied from the chart of Angelica Fox. Lactation Consultation Note  Baby has not been latching consistently.  RN offered to help when mom requested LC but mom refused RN's help and stated she wanted to wait for the Lactation Consultant. Mom has well developed breasts with palpable glandular tissue.  Her nipples are flat but compressible.  Mom reports that Angelica Fox stays on the breast for 10-20 minutes but only is sucking for 3-4 minutes.  She reports 2 "good" feedings this afternoon.  Baby is also receiving bottles of formula.  At this consultation Angelica Fox is hungry but not latching deeply.  She is latching with a shallow latch and is having difficulty maintaining the latch.  Suck assessment reveals that she is not opening wide or positioning her tongue well.  It is directly behind the lower alveolar ridge and does not easily allow a gloved finger into her mouth.  Over a short period of time she did pull the gloved finger deeper in to her mouth but did not pull it to the hard and soft palate juncture.  Spoon feeding was discussed with the goal being helping Angelica Fox to extend her tongue.  She ate about 2 ml but had difficulty lapping up the milk and swallowing. I suspect using an artifical nipple is affecting her latching.   SNS discussed and initiated with parents permission.  She took about 6 ml was burped and took another 7.  She suckled deeply and rhythmically.  Angelica Fox pulled Dad's finger back to the hard and soft palate juncture.  Plan is to put the baby to breast and to watch for rhythmic suckles.  If she does not feed consistently for at least 10 minutes she will be finger fed formula and mom will pump.  Parents have formula handouts. Explained to parents that effective BF or expression needed to occur at least 8 times in 24.  Pump set up and initiated pumping.  Mother removed pump after about 5 minutes and stated "I can't do this now.  I will work on it by myself". Mom did not seem to  be engaged at his consult.  I did ask mom what her feeding goals were because her admission information stated that she was breast and formula feeding her baby and formula was initiated at 5 hours of life and has been given consistently.  She stated that her goal was to BF and that she did not want to use formula but initiated it because she was not producing enough.  Patient Name: Angelica Fox [id number] Date: 05/18/2015 Reason for consult: Initial assessment;Infant < 6lbs;Difficult latch   Maternal Data Has patient been taught Hand Expression?: Yes Does the patient have breastfeeding experience prior to this delivery?: No  Feeding Feeding Type: Formula Length of feed: 10 min  LATCH Score/Interventions Latch: Repeated attempts needed to sustain latch, nipple held in mouth throughout feeding, stimulation needed to elicit sucking reflex. Intervention(s): Adjust position;Assist with latch;Breast compression  Audible Swallowing: A few with stimulation Intervention(s): Hand expression  Type of Nipple: Flat (very compressible) Intervention(s): No intervention needed  Comfort (Breast/Nipple): Soft / non-tender     Hold (Positioning): Assistance needed to correctly position infant at breast and maintain latch. Intervention(s): Breastfeeding basics reviewed  LATCH Score: 6  Lactation Tools Discussed/Used     Consult Status Consult Status: Follow-up Date: 05/18/15 Follow-up type: In-patient    Soyla Dryer 05/18/2015, 5:18 PM

## 2015-05-18 NOTE — Progress Notes (Signed)
Subjective: Postpartum Day 2: Cesarean Delivery Patient reports tolerating PO and no problems voiding.    Objective: Vital signs in last 24 hours: Temp:  [97.9 F (36.6 C)-98.2 F (36.8 C)] 98.2 F (36.8 C) (09/02 0640) Pulse Rate:  [65-73] 65 (09/02 0640) Resp:  [16-18] 18 (09/02 0640) BP: (106-110)/(62) 106/62 mmHg (09/02 0640) SpO2:  [96 %-97 %] 96 % (09/01 1818)  Physical Exam:  General: alert and cooperative Lochia: appropriate Uterine Fundus: firm Incision: healing well DVT Evaluation: No evidence of DVT seen on physical exam. Negative Homan's sign. No cords or calf tenderness.  Calf/ankle edema noted   Recent Labs  05/16/15 2138 05/17/15 0525  HGB 10.0* 9.4*  HCT 29.3* 28.1*    Assessment/Plan: Status post Cesarean section. Doing well postoperatively.  Continue current care.  CURTIS,CAROL G 05/18/2015, 8:20 AM

## 2015-05-18 NOTE — Clinical Social Work Maternal (Signed)
  CLINICAL SOCIAL WORK MATERNAL/CHILD NOTE  Patient Details  Name: Angelica Fox MRN: [id number] Date of Birth: 05/29/1981  Date:  05/18/2015  Clinical Social Worker Initiating Note:  Angelica Books, LCSW Date/ Time Initiated:  05/18/15/1330     Child's Name:  Angelica Fox   Legal Guardian:  Angelica Fox and Angelica Fox   Need for Interpreter:  None   Date of Referral:  05/16/15     Reason for Referral:  History of depression and anxiety   Referral Source:  [facility]   Address:  [address] [place], [place] [address]  Phone number:  [phone]   Household Members:  Spouse   Natural Supports (not living in the home):  Immediate Family, Extended Family   Professional Supports: None   Employment: Full-time   Type of Work: Airline pilot   Education:    N/A  Architect:  Media planner   Other Resources:    None identified   Cultural/Religious Considerations Which May Impact Care:  None reported  Strengths:  Ability to meet basic needs , Home prepared for child , Pediatrician chosen    Risk Factors/Current Problems:   1)Mental Health Concerns: MOB reports history of depression, anxiety, and panic attacks. MOB reported that she has previously been prescribed medications to assist with symptoms, most recently prescribed Prozac until she became pregnant. MOB denied increase in symptoms during the pregnancy, and shared plans to consult with her NP in upcoming weeks to reassess her mental health.  Cognitive State:  Able to Concentrate , Alert , Goal Oriented , Insightful , Linear Thinking    Mood/Affect:  Animated, Happy , Interested    CSW Assessment:  CSW received request for consult due to MOB presenting with a history of anxiety, depression, and panic attacks.  MOB presented as easily engaged and receptive to the visit. She was in a pleasant mood and displayed a full range in affect.  MOB presents with insight and self awareness related to her  mental health, and verbalized a plan to continue to monitor her mental health postpartum.  MOB reported history of anxiety, depression, and panic attacks "from time to time".  She shared that there have been events and stressors throughout life that have lead to symptoms.  MOB stated that she has a history of medication management, and was most recently prescribed Prozac until she became pregnant.  MOB stated that she discontinued medication with the pregnancy, and shared belief that she did not experience increase of symptoms without the medication.  MOB verbalized previous knowledge and awareness of her increased risk for perinatal mood and anxiety disorders, and shared intention to follow up with her NP during upcoming weeks to monitor for symptoms.  She stated that she has strong family support, and identified numerous friends who are also supportive. MOB reported that she is receptive to asking for and accepting help, and shared that they are all aware of her mental health history and have agreed to help her monitor for symptoms.  MOB denied questions or concerns as she transitions postpartum.  She shared that she is attempting to be flexible about birthing and feeding plans since she knows that "anything can happen".  MOB acknowledged ongoing availability of CSW, expressed appreciation for the visit, and agreed to contact CSW if needs arise.  CSW Plan/Description:   1)Patient/Family Education: Perinatal mood and anxiety disorders 2)No Further Intervention Required/No Barriers to Discharge    Angelica Fox 05/18/2015, 1:54 PM

## 2015-05-19 LAB — TYPE AND SCREEN
ABO/RH(D): O NEG
Antibody Screen: POSITIVE
DAT, IgG: NEGATIVE
UNIT DIVISION: 0
Unit division: 0

## 2015-05-19 MED ORDER — OXYCODONE-ACETAMINOPHEN 7.5-325 MG PO TABS: 1.0000 | ORAL_TABLET | ORAL | Status: DC | PRN

## 2015-05-19 NOTE — Discharge Summary (Signed)
Obstetric Discharge Summary Reason for Admission: induction of labor Prenatal Procedures: Preeclampsia Intrapartum Procedures: cesarean: low cervical, transverse Postpartum Procedures: none Complications-Operative and Postpartum: none HEMOGLOBIN  Date Value Ref Range Status  05/17/2015 9.4* 12.0 - 15.0 g/dL Final   HCT  Date Value Ref Range Status  05/17/2015 28.1* 36.0 - 46.0 % Final    Physical Exam:  General: alert Lochia: appropriate Uterine Fundus: firm Incision: healing well DVT Evaluation: No evidence of DVT seen on physical exam.  Discharge Diagnoses: Term Pregnancy-delivered  Discharge Information: Date: 05/19/2015 Activity: pelvic rest Diet: routine Medications: PNV and Percocet Condition: stable Instructions: refer to practice specific booklet Discharge to: home   Newborn Data: Live born female  Birth Weight: 6 lb (2722 g) APGAR: 8, 9  Home with mother.  Angelica Fox S 05/19/2015, 8:34 AM

## 2016-11-05 ENCOUNTER — Encounter: Payer: Self-pay | Admitting: Genetic Counselor

## 2016-11-05 ENCOUNTER — Telehealth: Payer: Self-pay | Admitting: Genetic Counselor

## 2016-11-05 NOTE — Telephone Encounter (Signed)
Pt returned call to schedule an appt. Appt has been scheduled for the pt to see a genetic counselor on 2/27 at 930am. Pt aware to arrive 30 minutes early. Demographics verified.

## 2016-11-11 ENCOUNTER — Ambulatory Visit (HOSPITAL_BASED_OUTPATIENT_CLINIC_OR_DEPARTMENT_OTHER): Payer: BLUE CROSS/BLUE SHIELD

## 2016-11-11 ENCOUNTER — Other Ambulatory Visit: Payer: BLUE CROSS/BLUE SHIELD

## 2016-11-11 DIAGNOSIS — Z315 Encounter for genetic counseling: Secondary | ICD-10-CM

## 2016-11-11 DIAGNOSIS — Z803 Family history of malignant neoplasm of breast: Secondary | ICD-10-CM

## 2016-11-11 NOTE — Progress Notes (Signed)
REFERRING PROVIDER: Louretta Shorten, MD [address] [place], [place] [address]  PRIMARY PROVIDER:  No primary care provider on file.  PRIMARY REASON FOR VISIT:  1. Family history of malignant neoplasm of breast     HISTORY OF PRESENT ILLNESS:   Angelica Fox, a 36 y.o. female, was seen for a [facility] cancer genetics consultation at the request of Dr. Corinna Capra due to a family history of cancer.  Angelica Fox presents to clinic today to discuss the possibility of a hereditary predisposition to cancer, genetic testing, and to further clarify her future cancer risks, as well as potential cancer risks for family members.   Angelica Fox is a 37 y.o. female with no personal history of cancer.    CANCER HISTORY:   No history exists.   HORMONAL RISK FACTORS:  Menarche was at age 66.  First live birth at age 72.  OCP use for approximately not assessed.  Ovaries intact: yes.  Hysterectomy: no.  Menopausal status: premenopausal.  HRT use: 0 years. Colonoscopy: no; not examined. Mammogram within the last year: no. Number of breast biopsies: 0. Up to date with pelvic exams:  yes. Any excessive radiation exposure in the past:  no  Past Medical History:  Diagnosis Date  . Anemia   . Anxiety   . Depression   . Headache   . Hx of varicella     Past Surgical History:  Procedure Laterality Date  . CESAREAN SECTION N/A 05/16/2015   Procedure: CESAREAN SECTION;  Surgeon: Louretta Shorten, MD;  Location: [facility];  Service: Obstetrics;  Laterality: N/A;    Social History   Social History  . Marital status: Married    Spouse name: N/A  . Number of children: N/A  . Years of education: N/A   Social History Main Topics  . Smoking status: Never Smoker  . Smokeless tobacco: Never Used  . Alcohol use No  . Drug use: No  . Sexual activity: Yes   Other Topics Concern  . Not on file   Social History Narrative  . No narrative on file     FAMILY HISTORY:  We obtained a detailed,  4-generation family history.  Significant diagnoses are listed below: Family History  Problem Relation Age of Onset  . Cancer Mother 59    breast  . Heart disease Paternal Grandmother   . Heart disease Paternal Grandfather   . Cancer Other 52    breast cancer  . Cancer Other 45    breast cancer   Angelica Fox is unaware of previous family history of genetic testing for hereditary cancer risks. Patient's maternal ancestors are of [place], [place] and [place] descent, and paternal ancestors are of [place] and [place] descent. There is no reported Ashkenazi Jewish ancestry. There is no known consanguinity.  GENETIC COUNSELING ASSESSMENT: Angelica Fox is a 36 y.o. female with a family history which is somewhat suggestive of a hereditary predisposition to cancer. We, therefore, discussed and recommended the following at today's visit.   DISCUSSION: We reviewed the characteristics, features and inheritance patterns of hereditary cancer syndromes. We also discussed genetic testing, including the appropriate family members to test, the process of testing, insurance coverage and turn-around-time for results. We discussed the implications of a negative, positive and/or variant of uncertain significant result. We recommended Angelica Fox pursue genetic testing for the 43 genes on [facility]'s Common Cancers panel (APC, ATM, AXIN2, BARD1, BMPR1A, BRCA1, BRCA2, BRIP1, CDH1, CDKN2A, CHEK2, DICER1, EPCAM, GREM1, HOXB13, KIT, MEN1,  MLH1, MSH2, MSH6, MUTYH, NBN, NF1, PALB2, PDGFRA, PMS2, POLD1, POLE, PTEN, RAD50, RAD51C, RAD51D, SDHA, SDHB, SDHC, SDHD, SMAD4, SMARCA4, STK11, TP53, TSC1, TSC2, VHL).  Based on Angelica Fox's family history of cancer, she meets medical criteria for genetic testing. Despite that she meets criteria, she may still have an out of pocket cost. We discussed that if her out of pocket cost for testing is over $100, the laboratory will call and confirm whether she wants to proceed with testing.   If the out of pocket cost of testing is less than $100 she will be billed by the genetic testing laboratory.   Based on the patient's personal and family history, statistical models (Tyrer-Cuzick)  and literature data were used to estimate her risk of developing breast cancer. These estimate her lifetime risk of developing breast cancer to be approximately 27.7%. This estimation does not take into account any genetic testing results.  The patient's lifetime breast cancer risk is a preliminary estimate based on available information using one of several models endorsed by the [employer] (ACS). The ACS recommends consideration of breast MRI screening as an adjunct to mammography for patients at high risk (defined as 20% or greater lifetime risk). A more detailed breast cancer risk assessment can be considered, if clinically indicated.   Angelica Fox has been determined to be at high risk for breast cancer.  Therefore, we recommend that annual screening with mammography and breast MRI begin at age 21.  We discussed that Angelica Fox should discuss her individual situation with her referring physician and determine a breast cancer screening plan with which they are both comfortable.    PLAN: After considering the risks, benefits, and limitations, Angelica Fox  provided informed consent to pursue genetic testing and the blood sample was sent to [facility] for analysis of the Common Hereditary Cancer panel gene test. Results should be available within approximately 2-3 weeks' time, at which point they will be disclosed by telephone to Angelica Fox, as will any additional recommendations warranted by these results. Angelica Fox will receive a summary of her genetic counseling visit and a copy of her results once available. This information will also be available in Epic. We encouraged Angelica Fox to remain in contact with cancer genetics annually so that we can continuously update the family  history and inform her of any changes in cancer genetics and testing that may be of benefit for her family. Angelica Fox questions were answered to her satisfaction today. Our contact information was provided should additional questions or concerns arise.  Based on Angelica Fox's family history, we recommended her mother, who was diagnosed with breast cancer at age 73, have genetic counseling and testing. Angelica Fox will let us know if we can be of any assistance in coordinating genetic counseling and/or testing for this family member. She stated that it would be very difficult for her mother to pursue testing at this time.  Lastly, we encouraged Ms. Vandenbos to remain in contact with cancer genetics annually so that we can continuously update the family history and inform her of any changes in cancer genetics and testing that may be of benefit for this family.   Ms.  Cavendish questions were answered to her satisfaction today. Our contact information was provided should additional questions or concerns arise. Thank you for the referral and allowing [name] to share in the care of your patient.   Benay Pike, MS, [facility] Certified Genetic Counselor  The patient was seen  for a total of 45 minutes in face-to-face genetic counseling.  This patient was discussed with Drs. Magrinat, Lindi Adie and/or Burr Medico who agrees with the above.    _______________________________________________________________________ For Office Staff:  Number of people involved in session: 2 Was an Intern/ student involved with case: no

## 2016-11-27 ENCOUNTER — Ambulatory Visit: Payer: Self-pay | Admitting: Genetic Counselor

## 2016-11-27 DIAGNOSIS — Z803 Family history of malignant neoplasm of breast: Secondary | ICD-10-CM

## 2016-11-27 DIAGNOSIS — Z1379 Encounter for other screening for genetic and chromosomal anomalies: Secondary | ICD-10-CM | POA: Insufficient documentation

## 2016-12-01 ENCOUNTER — Telehealth: Payer: Self-pay | Admitting: Genetic Counselor

## 2016-12-01 NOTE — Telephone Encounter (Signed)
Discussed genetic testing results with patient. See encounter note for more details.

## 2016-12-01 NOTE — Progress Notes (Signed)
Angelica Fox    Patient Name: Angelica Fox Patient DOB: 1980/11/05 Patient Age: 36 y.o. Encounter Date: 11/27/2016  Referring Provider: Louretta Shorten, MD  Primary Care Provider: No primary care provider on file.  Angelica Fox was called today to discuss genetic test results. Please see the Genetics note from her visit on 11/11/16 for a detailed discussion of her personal and family history.  Genetic Testing: At the time of Angelica Fox's visit, we recommended she pursue genetic testing of multiple genes associated with a hereditary predisposition to cancer. Testing included sequencing and deletion/duplication analysis of 43 genes on [facility]'s Common Cancers panel (APC, ATM, AXIN2, BARD1, BMPR1A, BRCA1, BRCA2, BRIP1, CDH1, CDKN2A, CHEK2, DICER1, EPCAM, GREM1, HOXB13, KIT, MEN1, MLH1, MSH2, MSH6, MUTYH, NBN, NF1, PALB2, PDGFRA, PMS2, POLD1, POLE, PTEN, RAD50, RAD51C, RAD51D, SDHA, SDHB, SDHC, SDHD, SMAD4, SMARCA4, STK11, TP53, TSC1, TSC2, VHL). Testing was normal and did not reveal a mutation in these genes. A copy of the genetic test report will be scanned into Epic under the media tab.  Since the current test is not perfect, it is possible there may be a gene mutation that current testing cannot detect, but that chance is small. We also discussed that it is possible that a different genetic factor, which was not part of this testing or has not yet been discovered, is responsible for the cancer diagnoses in the family. Again, the likelihood of this is low. No additional testing is recommended at this time.   Cancer Screening: Given the personal and family histories, we must interpret these negative results with some caution. Families with features suggestive of hereditary risk tend to have multiple family members with cancer, diagnoses in multiple generations and diagnoses before the age of 49. Angelica Fox family exhibits some of these features.  This result may simply reflect our current inability to detect all mutations within these genes. It is also possible there is a different gene, that we have not yet tested, that may be responsible for the cancer in this family.   We discussed that it would be reasonable for Angelica Fox be followed by a high-risk breast Fox as her lifetime risk for breast cancer is estimated to be around 27% with the Tyrer-Cuzick model; in addition to a yearly mammogram and physical exam twice per year, she should discuss the usefulness of an annual breast MRI with the high-risk Fox providers. We recommended Angelica Fox discuss these options further with her gynecologist.  Family Members:  We also recommended further genetic testing in Angelica Fox's family as such testing might help [name] be even more confident in interpreting Angelica Fox's own results. Genetic testing is best initiated in someone who has had cancer.  In this family, her mother would be the most informative person to test. Please let us know if we can help facilitate testing. Genetic counselors can be located in other cities, by visiting the website of the [employer] (ArtistMovie.se) and Field seismologist for a Dietitian by zip code.  Lastly, cancer genetics is a rapidly advancing field and it is possible that new genetic tests will be appropriate for her in the future. We encourage her to remain in contact with [name] on an annual basis so we can update her personal and family histories, and let her know of advances in cancer genetics that may benefit the family. Our contact number was provided. Angelica Fox is welcome to call anytime  with additional questions.    Marylouise Stacks, MS, [facility] Certified Genetic Counselor phone: [phone] [email or web address]

## 2016-12-17 ENCOUNTER — Encounter (HOSPITAL_COMMUNITY): Payer: Self-pay

## 2017-09-18 DIAGNOSIS — J0100 Acute maxillary sinusitis, unspecified: Secondary | ICD-10-CM | POA: Diagnosis not present

## 2017-11-06 DIAGNOSIS — J1089 Influenza due to other identified influenza virus with other manifestations: Secondary | ICD-10-CM | POA: Diagnosis not present

## 2019-10-27 ENCOUNTER — Other Ambulatory Visit: Payer: BLUE CROSS/BLUE SHIELD

## 2020-01-02 ENCOUNTER — Ambulatory Visit: Payer: 59 | Attending: Internal Medicine

## 2020-01-02 ENCOUNTER — Other Ambulatory Visit: Payer: 59

## 2020-01-02 DIAGNOSIS — Z20822 Contact with and (suspected) exposure to covid-19: Secondary | ICD-10-CM

## 2020-01-03 LAB — SARS-COV-2, NAA 2 DAY TAT

## 2020-01-03 LAB — NOVEL CORONAVIRUS, NAA: SARS-CoV-2, NAA: NOT DETECTED

## 2020-12-07 ENCOUNTER — Other Ambulatory Visit: Payer: Self-pay

## 2020-12-07 ENCOUNTER — Emergency Department (HOSPITAL_BASED_OUTPATIENT_CLINIC_OR_DEPARTMENT_OTHER)
Admission: EM | Admit: 2020-12-07 | Discharge: 2020-12-08 | Disposition: A | Discharge status: Home / Self Care | Payer: 59 | Attending: Emergency Medicine | Admitting: Emergency Medicine

## 2020-12-07 ENCOUNTER — Encounter (HOSPITAL_BASED_OUTPATIENT_CLINIC_OR_DEPARTMENT_OTHER): Payer: Self-pay

## 2020-12-07 ENCOUNTER — Emergency Department (HOSPITAL_BASED_OUTPATIENT_CLINIC_OR_DEPARTMENT_OTHER): Payer: 59

## 2020-12-07 DIAGNOSIS — R110 Nausea: Secondary | ICD-10-CM | POA: Insufficient documentation

## 2020-12-07 DIAGNOSIS — R519 Headache, unspecified: Secondary | ICD-10-CM | POA: Diagnosis not present

## 2020-12-07 LAB — CBC WITH DIFFERENTIAL/PLATELET
Abs Immature Granulocytes: 0.05 10*3/uL (ref 0.00–0.07)
Basophils Absolute: 0.1 10*3/uL (ref 0.0–0.1)
Basophils Relative: 1 %
Eosinophils Absolute: 0.3 10*3/uL (ref 0.0–0.5)
Eosinophils Relative: 4 %
HCT: 39.4 % (ref 36.0–46.0)
Hemoglobin: 13.3 g/dL (ref 12.0–15.0)
Immature Granulocytes: 1 %
Lymphocytes Relative: 25 %
Lymphs Abs: 2.1 10*3/uL (ref 0.7–4.0)
MCH: 29.4 pg (ref 26.0–34.0)
MCHC: 33.8 g/dL (ref 30.0–36.0)
MCV: 87 fL (ref 80.0–100.0)
Monocytes Absolute: 0.5 10*3/uL (ref 0.1–1.0)
Monocytes Relative: 6 %
Neutro Abs: 5.4 10*3/uL (ref 1.7–7.7)
Neutrophils Relative %: 63 %
Platelets: 356 10*3/uL (ref 150–400)
RBC: 4.53 MIL/uL (ref 3.87–5.11)
RDW: 13.9 % (ref 11.5–15.5)
WBC: 8.5 10*3/uL (ref 4.0–10.5)
nRBC: 0 % (ref 0.0–0.2)

## 2020-12-07 LAB — BASIC METABOLIC PANEL
Anion gap: 10 (ref 5–15)
BUN: 15 mg/dL (ref 6–20)
CO2: 24 mmol/L (ref 22–32)
Calcium: 9.1 mg/dL (ref 8.9–10.3)
Chloride: 100 mmol/L (ref 98–111)
Creatinine, Ser: 0.68 mg/dL (ref 0.44–1.00)
GFR, Estimated: >60 mL/min (ref >60)
Glucose, Bld: 102 mg/dL — ABNORMAL HIGH (ref 70–99)
Potassium: 4 mmol/L (ref 3.5–5.1)
Sodium: 134 mmol/L — ABNORMAL LOW (ref 135–145)

## 2020-12-07 LAB — HCG, SERUM, QUALITATIVE: Preg, Serum: NEGATIVE

## 2020-12-07 MED ORDER — FENTANYL CITRATE (PF) 100 MCG/2ML IJ SOLN
100.0000 ug | Freq: Once | INTRAMUSCULAR | Status: AC
  Administered 2020-12-07: 100 ug via INTRAVENOUS
  Filled 2020-12-07: qty 2

## 2020-12-07 MED ORDER — METOCLOPRAMIDE HCL 5 MG/ML IJ SOLN
10.0000 mg | Freq: Once | INTRAMUSCULAR | Status: AC
Start: 2020-12-07 — End: 2020-12-07
  Administered 2020-12-07: 10 mg via INTRAVENOUS
  Filled 2020-12-07: qty 2

## 2020-12-07 MED ORDER — IOHEXOL 350 MG/ML SOLN
100.0000 mL | Freq: Once | INTRAVENOUS | Status: AC | PRN
  Administered 2020-12-07: 100 mL via INTRAVENOUS

## 2020-12-07 MED ORDER — SODIUM CHLORIDE 0.9 % IV SOLN: INTRAVENOUS | Status: DC | PRN

## 2020-12-07 MED ORDER — DIPHENHYDRAMINE HCL 50 MG/ML IJ SOLN
12.5000 mg | Freq: Once | INTRAMUSCULAR | Status: AC
  Administered 2020-12-07: 12.5 mg via INTRAVENOUS
  Filled 2020-12-07: qty 1

## 2020-12-07 MED ORDER — MAGNESIUM SULFATE 2 GM/50ML IV SOLN
2.0000 g | Freq: Once | INTRAVENOUS | Status: AC
  Administered 2020-12-07: 2 g via INTRAVENOUS
  Filled 2020-12-07: qty 50

## 2020-12-07 NOTE — ED Provider Notes (Signed)
[facility] EMERGENCY DEPARTMENT Provider Note   CSN: [id number] Arrival date & time: 12/07/20  2018     History Chief Complaint  Patient presents with  . Headache    Angelica Fox is a 40 y.o. female.  She is complaining of acute onset of 10 out of 10 headache after moving some furniture.  It started occipitally and now is more on the top of her head.  She rates it a 7 out of 10 now.  No pain in the neck now.  No blurry vision double vision.  She was nauseous.  No head trauma.  History of migraines but this is not similar.  Started about 3 hours ago.  No numbness or weakness.  She went to urgent care where they gave her a shot of Phenergan and recommended she be seen in the emergency department  The history is provided by the patient.  Headache Pain location:  Occipital Quality:  Stabbing Radiates to:  Does not radiate Severity currently:  7/10 Severity at highest:  10/10 Onset quality:  Sudden Duration:  3 hours Timing:  Constant Progression:  Improving Chronicity:  New Similar to prior headaches: no   Relieved by:  None tried Worsened by:  Nothing Ineffective treatments:  None tried Associated symptoms: nausea   Associated symptoms: no abdominal pain, no blurred vision, no facial pain, no fever, no focal weakness, no loss of balance, no neck pain, no neck stiffness, no paresthesias, no photophobia, no seizures, no sore throat, no tingling, no visual change and no vomiting        Past Medical History:  Diagnosis Date  . Anemia   . Anxiety   . Depression   . Headache   . Hx of varicella     Patient Active Problem List   Diagnosis Date Noted  . Genetic testing 11/27/2016  . Indication for care in labor and delivery, antepartum 05/15/2015    Past Surgical History:  Procedure Laterality Date  . CESAREAN SECTION N/A 05/16/2015   Procedure: CESAREAN SECTION;  Surgeon: Candice Campavid Lowe, MD;  Location: [facility];  Service: Obstetrics;  Laterality: N/A;     OB  History    Gravida  3   Para  1   Term  1   Preterm      AB  2   Living  1     SAB  1   IAB  1   Ectopic      Multiple  0   Live Births  1           Family History  Problem Relation Age of Onset  . Cancer Mother [id number]       breast  . Heart disease Paternal Grandmother   . Heart disease Paternal Grandfather   . Cancer Other 52       breast cancer  . Cancer Other 45       breast cancer    Social History   Tobacco Use  . Smoking status: Never Smoker  . Smokeless tobacco: Never Used  Substance Use Topics  . Alcohol use: Yes  . Drug use: No    Home Medications Prior to Admission medications   Medication Sig Start Date End Date Taking? Authorizing Provider  acetaminophen (TYLENOL) 325 MG tablet Take 650 mg by mouth every 6 (six) hours as needed for mild pain or headache.    [provider]  calcium carbonate (TUMS - DOSED IN MG ELEMENTAL CALCIUM) 500 MG chewable tablet Chew  2 tablets by mouth 2 (two) times daily as needed for indigestion.    [provider]  Fe Cbn-Fe Gluc-FA-B12-C-DSS (FERRALET 90) 90-1 MG TABS Take 1 tablet by mouth 2 (two) times daily.    [provider]  oxyCODONE-acetaminophen (PERCOCET) 7.5-325 MG per tablet Take 1 tablet by mouth every 4 (four) hours as needed for severe pain. 05/19/15   Richardean Chimera, MD  Prenatal Vit-Fe Fumarate-FA (PRENATAL MULTIVITAMIN) TABS tablet Take 1 tablet by mouth daily at 12 noon.    [provider]    Allergies    Patient has no known allergies.  Review of Systems   Review of Systems  Constitutional: Negative for fever.  HENT: Negative for sore throat.   Eyes: Negative for blurred vision and photophobia.  Respiratory: Negative for shortness of breath.   Cardiovascular: Negative for chest pain.  Gastrointestinal: Positive for nausea. Negative for abdominal pain and vomiting.  Genitourinary: Negative for dysuria.  Musculoskeletal: Negative for neck pain and neck  stiffness.  Skin: Negative for rash.  Neurological: Positive for headaches. Negative for focal weakness, seizures, paresthesias and loss of balance.    Physical Exam Updated Vital Signs BP (!) 157/101   Pulse 73   Temp 98.2 F (36.8 C) (Oral)   Resp 18   Ht 5\' 2"  (1.575 m)   Wt 88.5 kg   LMP 11/30/2020   SpO2 100%   BMI 35.67 kg/m   Physical Exam Vitals and nursing note reviewed.  Constitutional:      General: She is not in acute distress.    Appearance: She is well-developed.  HENT:     Head: Normocephalic and atraumatic.  Eyes:     Conjunctiva/sclera: Conjunctivae normal.  Cardiovascular:     Rate and Rhythm: Normal rate and regular rhythm.     Heart sounds: No murmur heard.   Pulmonary:     Effort: Pulmonary effort is normal. No respiratory distress.     Breath sounds: Normal breath sounds.  Abdominal:     Palpations: Abdomen is soft.     Tenderness: There is no abdominal tenderness.  Musculoskeletal:     Cervical back: Neck supple.  Skin:    General: Skin is warm and dry.     Capillary Refill: Capillary refill takes less than 2 seconds.  Neurological:     Mental Status: She is alert.     GCS: GCS eye subscore is 4. GCS verbal subscore is 5. GCS motor subscore is 6.     Cranial Nerves: No cranial nerve deficit, dysarthria or facial asymmetry.     Sensory: No sensory deficit.     Motor: No weakness.     Gait: Gait normal.     ED Results / Procedures / Treatments   Labs (all labs ordered are listed, but only abnormal results are displayed) Labs Reviewed  BASIC METABOLIC PANEL - Abnormal; Notable for the following components:      Result Value   Sodium 134 (*)    Glucose, Bld 102 (*)    All other components within normal limits  CBC WITH DIFFERENTIAL/PLATELET  HCG, SERUM, QUALITATIVE    EKG None  Radiology CT Angio Head W/Cm &/Or Wo Cm  Result Date: 12/07/2020 CLINICAL DATA:  Initial evaluation for acute headache. EXAM: CT ANGIOGRAPHY HEAD AND  NECK TECHNIQUE: Multidetector CT imaging of the head and neck was performed using the standard protocol during bolus administration of intravenous contrast. Multiplanar CT image reconstructions and MIPs were obtained to evaluate the  vascular anatomy. Carotid stenosis measurements (when applicable) are obtained utilizing NASCET criteria, using the distal internal carotid diameter as the denominator. CONTRAST:  OMNIPAQUE IOHEXOL 350 MG/ML SOLN COMPARISON:  None available. FINDINGS: CT HEAD FINDINGS Brain: Cerebral volume within normal limits for patient age. No evidence for acute intracranial hemorrhage. No findings to suggest acute large vessel territory infarct. No mass lesion, midline shift, or mass effect. Ventricles are normal in size without evidence for hydrocephalus. No extra-axial fluid collection identified. Vascular: No hyperdense vessel identified. Skull: Scalp soft tissues demonstrate no acute abnormality. Calvarium intact. Sinuses/Orbits: Globes and orbital soft tissues within normal limits. Visualized paranasal sinuses are clear. Trace right mastoid effusion noted, of doubtful significance. CTA NECK FINDINGS Aortic arch: Visualized aortic arch normal caliber with normal branch pattern. No hemodynamically significant stenosis about the origin of the great vessels. Visualized subclavian arteries widely patent. Right carotid system: Right CCA patent from its origin to the bifurcation without stenosis. Mild soft plaque at the origin of the right ICA with no more than mild 35% stenosis by NASCET criteria. Right ICA widely patent distally without stenosis, dissection or occlusion. Left carotid system: Left common and internal carotid arteries widely patent without stenosis, dissection or occlusion. Vertebral arteries: Both vertebral arteries arise from the subclavian arteries. No proximal subclavian artery stenosis. Both vertebral arteries widely patent without stenosis, dissection or occlusion.  Skeleton: No acute osseous finding. No discrete or worrisome osseous lesions. Other neck: No other acute soft tissue abnormality within the neck. No mass or adenopathy. Upper chest: Visualized upper chest demonstrates no acute finding. Review of the MIP images confirms the above findings CTA HEAD FINDINGS Anterior circulation: Both internal carotid arteries patent to the termini without stenosis or other abnormality. A1 segments widely patent. Normal anterior communicating artery complex. Anterior cerebral arteries patent to their distal aspects without stenosis. No M1 stenosis or occlusion. Normal MCA bifurcations. Distal MCA branches well perfused and symmetric. Posterior circulation: Both V4 segments widely patent to the vertebrobasilar junction without stenosis. Both PICA origins patent and normal. Basilar patent to its distal aspect without stenosis. Superior cerebellar arteries patent bilaterally. Left PCA supplied via the basilar. Right PCA supplied via the basilar as well as a prominent right posterior communicating artery. Both PCAs well perfused to their distal aspects. Venous sinuses: Grossly patent allowing for timing the contrast bolus. Anatomic variants: None significant.  No intracranial aneurysm. Review of the MIP images confirms the above findings IMPRESSION: 1. Normal CTA of the head and neck. No large vessel occlusion, hemodynamically significant stenosis, or other acute vascular abnormality. No aneurysm. 2. Otherwise negative head CT. No other acute intracranial abnormality. Electronically Signed   By: Rise Mu M.D.   On: 12/07/2020 22:26   CT Angio Neck W and/or Wo Contrast  Result Date: 12/07/2020 CLINICAL DATA:  Initial evaluation for acute headache. EXAM: CT ANGIOGRAPHY HEAD AND NECK TECHNIQUE: Multidetector CT imaging of the head and neck was performed using the standard protocol during bolus administration of intravenous contrast. Multiplanar CT image reconstructions and  MIPs were obtained to evaluate the vascular anatomy. Carotid stenosis measurements (when applicable) are obtained utilizing NASCET criteria, using the distal internal carotid diameter as the denominator. CONTRAST:  OMNIPAQUE IOHEXOL 350 MG/ML SOLN COMPARISON:  None available. FINDINGS: CT HEAD FINDINGS Brain: Cerebral volume within normal limits for patient age. No evidence for acute intracranial hemorrhage. No findings to suggest acute large vessel territory infarct. No mass lesion, midline shift, or mass effect. Ventricles are normal in  size without evidence for hydrocephalus. No extra-axial fluid collection identified. Vascular: No hyperdense vessel identified. Skull: Scalp soft tissues demonstrate no acute abnormality. Calvarium intact. Sinuses/Orbits: Globes and orbital soft tissues within normal limits. Visualized paranasal sinuses are clear. Trace right mastoid effusion noted, of doubtful significance. CTA NECK FINDINGS Aortic arch: Visualized aortic arch normal caliber with normal branch pattern. No hemodynamically significant stenosis about the origin of the great vessels. Visualized subclavian arteries widely patent. Right carotid system: Right CCA patent from its origin to the bifurcation without stenosis. Mild soft plaque at the origin of the right ICA with no more than mild 35% stenosis by NASCET criteria. Right ICA widely patent distally without stenosis, dissection or occlusion. Left carotid system: Left common and internal carotid arteries widely patent without stenosis, dissection or occlusion. Vertebral arteries: Both vertebral arteries arise from the subclavian arteries. No proximal subclavian artery stenosis. Both vertebral arteries widely patent without stenosis, dissection or occlusion. Skeleton: No acute osseous finding. No discrete or worrisome osseous lesions. Other neck: No other acute soft tissue abnormality within the neck. No mass or adenopathy. Upper chest: Visualized upper chest  demonstrates no acute finding. Review of the MIP images confirms the above findings CTA HEAD FINDINGS Anterior circulation: Both internal carotid arteries patent to the termini without stenosis or other abnormality. A1 segments widely patent. Normal anterior communicating artery complex. Anterior cerebral arteries patent to their distal aspects without stenosis. No M1 stenosis or occlusion. Normal MCA bifurcations. Distal MCA branches well perfused and symmetric. Posterior circulation: Both V4 segments widely patent to the vertebrobasilar junction without stenosis. Both PICA origins patent and normal. Basilar patent to its distal aspect without stenosis. Superior cerebellar arteries patent bilaterally. Left PCA supplied via the basilar. Right PCA supplied via the basilar as well as a prominent right posterior communicating artery. Both PCAs well perfused to their distal aspects. Venous sinuses: Grossly patent allowing for timing the contrast bolus. Anatomic variants: None significant.  No intracranial aneurysm. Review of the MIP images confirms the above findings IMPRESSION: 1. Normal CTA of the head and neck. No large vessel occlusion, hemodynamically significant stenosis, or other acute vascular abnormality. No aneurysm. 2. Otherwise negative head CT. No other acute intracranial abnormality. Electronically Signed   By: Rise Mu M.D.   On: 12/07/2020 22:26    Procedures Procedures   Medications Ordered in ED Medications  fentaNYL (SUBLIMAZE) injection 100 mcg (100 mcg Intravenous Given 12/07/20 2051)  iohexol (OMNIPAQUE) 350 MG/ML injection 100 mL (100 mLs Intravenous Contrast Given 12/07/20 2112)  metoCLOPramide (REGLAN) injection 10 mg (10 mg Intravenous Given 12/07/20 2251)  diphenhydrAMINE (BENADRYL) injection 12.5 mg (12.5 mg Intravenous Given 12/07/20 2249)  magnesium sulfate IVPB 2 g 50 mL (0 g Intravenous Stopped 12/07/20 2358)    ED Course  I have reviewed the triage vital signs and  the nursing notes.  Pertinent labs & imaging results that were available during my care of the patient were reviewed by me and considered in my medical decision making (see chart for details).  Clinical Course as of 12/08/20 1020  Fri Dec 07, 2020  2233 CT CTA head and neck did not show any evidence of subarachnoid, or aneurysm.  Patient states her headache is somewhat improved but is recurred since she has had a CAT scan.  We will try some medications to help control her symptoms. [MB]  2311 Patient's care is signed out to oncoming provider Dr. Read Drivers to follow-up on patient's response to treatment for headache.  If  we can get her pain under control she likely can be discharged to follow-up with her primary care doctor. [MB]    Clinical Course User Index [MB] Terrilee Files, MD   MDM Rules/Calculators/A&P                         This patient complains of acute headache; this involves an extensive number of treatment Options and is a complaint that carries with it a high risk of complications and Morbidity. The differential includes subarachnoid,, tension headache, carotid dissection, vertebral dissection  I ordered, reviewed and interpreted labs, which included normal white count normal chemistries normal to test negative I ordered medication IV pain medication, migraine cocktail I ordered imaging studies which included CTA head and neck and I independently    visualized and interpreted imaging which showed endings, no subarachnoid, aneurysm Additional history obtained from patient's husband Previous records obtained and reviewed in epic including prior urgent care visit today  After the interventions stated above, I reevaluated the patient and found patient still to have moderate headache.  Her care is signed out to Dr. Read Drivers to follow-up to patient's response to further medications for her symptoms.   Final Clinical Impression(s) / ED Diagnoses Final diagnoses:  Generalized  headache    Rx / DC Orders ED Discharge Orders    None       Terrilee Files, MD 12/08/20 1023

## 2020-12-07 NOTE — Discharge Instructions (Addendum)
You are seen in the emergency department for an acute headache.  You had lab work pregnancy test and a CAT scan of your head and the blood vessels in your head and neck that did not show any obvious abnormalities.  Please follow-up with your regular primary care doctor.  Return to the emergency department for any worsening or concerning symptoms

## 2020-12-07 NOTE — ED Triage Notes (Addendum)
Pt arrives ambulatory to ED with c/o headache, Pt was seen at [facility] today and told to come to ED. Started at [employer]

## 2021-09-11 ENCOUNTER — Telehealth: Payer: 59 | Admitting: Physician Assistant

## 2021-09-11 DIAGNOSIS — J019 Acute sinusitis, unspecified: Secondary | ICD-10-CM

## 2021-09-11 DIAGNOSIS — H1132 Conjunctival hemorrhage, left eye: Secondary | ICD-10-CM | POA: Diagnosis not present

## 2021-09-11 DIAGNOSIS — B9689 Other specified bacterial agents as the cause of diseases classified elsewhere: Secondary | ICD-10-CM | POA: Diagnosis not present

## 2021-09-11 MED ORDER — AMOXICILLIN-POT CLAVULANATE 875-125 MG PO TABS: 1.0000 | ORAL_TABLET | Freq: Two times a day (BID) | ORAL | 0 refills | Status: DC

## 2021-09-11 NOTE — Patient Instructions (Signed)
Verda Cumins, thank you for joining Margaretann Loveless, PA-C for today's virtual visit.  While this provider is not your primary care provider (PCP), if your PCP is located in our provider database this encounter information will be shared with them immediately following your visit.  Consent: (Patient) Angelica Fox provided verbal consent for this virtual visit at the beginning of the encounter.  Current Medications:  Current Outpatient Medications:    amoxicillin-clavulanate (AUGMENTIN) 875-125 MG tablet, Take 1 tablet by mouth 2 (two) times daily., Disp: 14 tablet, Rfl: 0   acetaminophen (TYLENOL) 325 MG tablet, Take 650 mg by mouth every 6 (six) hours as needed for mild pain or headache., Disp: , Rfl:    calcium carbonate (TUMS - DOSED IN MG ELEMENTAL CALCIUM) 500 MG chewable tablet, Chew 2 tablets by mouth 2 (two) times daily as needed for indigestion., Disp: , Rfl:    Fe Cbn-Fe Gluc-FA-B12-C-DSS (FERRALET 90) 90-1 MG TABS, Take 1 tablet by mouth 2 (two) times daily., Disp: , Rfl:    oxyCODONE-acetaminophen (PERCOCET) 7.5-325 MG per tablet, Take 1 tablet by mouth every 4 (four) hours as needed for severe pain., Disp: 30 tablet, Rfl: 0   Prenatal Vit-Fe Fumarate-FA (PRENATAL MULTIVITAMIN) TABS tablet, Take 1 tablet by mouth daily at 12 noon., Disp: , Rfl:    Medications ordered in this encounter:  Meds ordered this encounter  Medications   amoxicillin-clavulanate (AUGMENTIN) 875-125 MG tablet    Sig: Take 1 tablet by mouth 2 (two) times daily.    Dispense:  14 tablet    Refill:  0    Order Specific Question:   Supervising Provider    Answer:   Hyacinth Meeker, BRIAN [[id number]]     *If you need refills on other medications prior to your next appointment, please contact your pharmacy*  Follow-Up: Call back or seek an in-person evaluation if the symptoms worsen or if the condition fails to improve as anticipated.  Other Instructions Subconjunctival Hemorrhage Subconjunctival  hemorrhage is bleeding that happens between the white part of your eye (sclera) and the clear membrane that covers the outside of your eye (conjunctiva). There are many tiny blood vessels near the surface of your eye. A subconjunctival hemorrhage happens when one or more of these vessels breaks and bleeds, causing a red patch to appear on your eye. This is similar to a bruise. Depending on the amount of bleeding, the red patch may only cover a small area of your eye or it may cover the entire visible part of the sclera. If a lot of blood collects under the conjunctiva, there may also be swelling. Subconjunctival hemorrhages do not affect your vision or cause pain, but your eye may feel irritated if there is swelling. Subconjunctival hemorrhages usually do not require treatment, and they usually disappear on their own within two to four weeks. What are the causes? This condition may be caused by: Mild trauma, such as rubbing your eye too hard. Blunt injuries, such as from playing sports or coming into contact with a deployed airbag. Coughing, sneezing, or vomiting. Straining, such as when lifting a heavy object. Medical conditions, such as: High blood pressure. Diabetes. Recent eye surgery. Certain medicines, especially blood thinners (anticoagulants), including aspirin. Other conditions, such as eye tumors, bleeding disorders, or blood vessel abnormalities. Subconjunctival hemorrhages can also happen without an obvious cause. What are the signs or symptoms? Symptoms of this condition include: A bright red or dark red patch on the white part of the eye. The red  area may: Spread out to cover a larger area of the eye before it goes away. Turn colors such as pink or brownish-yellow before it goes away. Swelling around the eye. Mild eye irritation. How is this diagnosed? This condition is diagnosed with a physical exam. If your subconjunctival hemorrhage was caused by trauma, your health care  provider may refer you to an eye specialist (ophthalmologist) or another specialist to check for other injuries. You may have other tests, including: An eye exam including a vision test, checking your eye with a type of microscope (slit lamp) and measuring the pressure in your eye. Your eye may be dilated, especially if your subconjunctival hemorrhage was caused by trauma. A blood pressure check. Blood tests to check for bleeding disorders. If your subconjunctival hemorrhage was caused by trauma, X-rays or a CT scan may be done to check for other injuries. How is this treated? Usually, treatment is not needed for this condition. If you have discomfort, your health care provider may recommend eye drops or cold compresses. Follow these instructions at home: Take over-the-counter and prescription medicines only as directed by your health care provider. Use eye drops or cold compresses to help with discomfort as directed by your health care provider. Avoid activities, things, and environments that may irritate or injure your eye. Keep all follow-up visits. This is important. Contact a health care provider if: You have pain in your eye. The bleeding does not go away within 4 weeks. You keep getting new subconjunctival hemorrhages. Get help right away if: Your vision changes, you have difficulty seeing, or you develop double vision. You suddenly develop severe sensitivity to light. You develop a severe headache, persistent vomiting, confusion, or abnormal tiredness (lethargy). Your eye seems to bulge or protrude from your eye socket. You develop unexplained bruises on your body. You have unexplained bleeding in another area of your body. These symptoms may represent a serious problem that is an emergency. Do not wait to see if the symptoms will go away. Get medical help right away. Call your local emergency services (911 in the [place] Do not drive yourself to the hospital. Summary Subconjunctival  hemorrhage is bleeding that happens between the white part of your eye and the clear membrane that covers the outside of your eye. This condition is similar to a bruise. Subconjunctival hemorrhages usually do not require treatment, and they usually disappear on their own within two to four weeks. Use eye drops or cold compresses to help with discomfort as directed by your health care provider. This information is not intended to replace advice given to you by your health care provider. Make sure you discuss any questions you have with your health care provider. Document Revised: 11/07/2020 Document Reviewed: 11/07/2020 Elsevier Patient Education  2022 [employer].    If you have been instructed to have an in-person evaluation today at a local Urgent Care facility, please use the link below. It will take you to a list of all of our available [facility], including address, phone number and hours of operation. Please do not delay care.  [facility]  If you or a family member do not have a primary care provider, use the link below to schedule a visit and establish care. When you choose a [facility] primary care physician or advanced practice provider, you gain a long-term partner in health. Find a Primary Care Provider  Learn more about [facility]'s in-office and virtual care options: [place] -  Get Care Now

## 2021-09-11 NOTE — Progress Notes (Signed)
Virtual Visit Consent   Angelica Fox, you are scheduled for a virtual visit with a [facility] provider today.     Just as with appointments in the office, your consent must be obtained to participate.  Your consent will be active for this visit and any virtual visit you may have with one of our providers in the next 365 days.     If you have a MyChart account, a copy of this consent can be sent to you electronically.  All virtual visits are billed to your insurance company just like a traditional visit in the office.    As this is a virtual visit, video technology does not allow for your provider to perform a traditional examination.  This may limit your provider's ability to fully assess your condition.  If your provider identifies any concerns that need to be evaluated in person or the need to arrange testing (such as labs, EKG, etc.), we will make arrangements to do so.     Although advances in technology are sophisticated, we cannot ensure that it will always work on either your end or our end.  If the connection with a video visit is poor, the visit may have to be switched to a telephone visit.  With either a video or telephone visit, we are not always able to ensure that we have a secure connection.     I need to obtain your verbal consent now.   Are you willing to proceed with your visit today?    Angelica Fox has provided verbal consent on 09/11/2021 for a virtual visit (video or telephone).   Margaretann Loveless, PA-C   Date: 09/11/2021 7:16 PM   Virtual Visit via Video Note   I, Margaretann Loveless, connected with  Angelica Fox  ([id number], 30-Jun-1981) on 09/11/21 at  7:00 PM EST by a video-enabled telemedicine application and verified that I am speaking with the correct person using two identifiers.  Location: Patient: Virtual Visit Location Patient: Home Provider: Virtual Visit Location Provider: Home Office   I discussed the limitations of evaluation and  management by telemedicine and the availability of in person appointments. The patient expressed understanding and agreed to proceed.    History of Present Illness: Angelica Fox is a 40 y.o. who identifies as a female who was assigned female at birth, and is being seen today for URI symptoms.  HPI: URI  This is a new problem. The current episode started in the past 7 days (Sunday started with body aches and fatigue; Saturday did have eye irritation and itching). The problem has been gradually worsening. Associated symptoms include congestion, coughing, nausea, sinus pain and vomiting (once with taking vitamins; noticed pressure in eye with vomiting and then next day had conjunctiva be red and bruising noted like a black eye). Pertinent negatives include no rhinorrhea or sore throat. Associated symptoms comments: Body aches and fatigue, chills. Treatments tried: Tylenol cold and sinus, flonase, vitamins, Theraflu, Mucinex DM. The treatment provided no relief.     Problems:  Patient Active Problem List   Diagnosis Date Noted   Genetic testing 11/27/2016   Indication for care in labor and delivery, antepartum 05/15/2015    Allergies: No Known Allergies Medications:  Current Outpatient Medications:    amoxicillin-clavulanate (AUGMENTIN) 875-125 MG tablet, Take 1 tablet by mouth 2 (two) times daily., Disp: 14 tablet, Rfl: 0   acetaminophen (TYLENOL) 325 MG tablet, Take 650 mg by mouth every 6 (six) hours as needed for mild  pain or headache., Disp: , Rfl:    calcium carbonate (TUMS - DOSED IN MG ELEMENTAL CALCIUM) 500 MG chewable tablet, Chew 2 tablets by mouth 2 (two) times daily as needed for indigestion., Disp: , Rfl:    Fe Cbn-Fe Gluc-FA-B12-C-DSS (FERRALET 90) 90-1 MG TABS, Take 1 tablet by mouth 2 (two) times daily., Disp: , Rfl:    oxyCODONE-acetaminophen (PERCOCET) 7.5-325 MG per tablet, Take 1 tablet by mouth every 4 (four) hours as needed for severe pain., Disp: 30 tablet, Rfl: 0    Prenatal Vit-Fe Fumarate-FA (PRENATAL MULTIVITAMIN) TABS tablet, Take 1 tablet by mouth daily at 12 noon., Disp: , Rfl:   Observations/Objective: Patient is well-developed, well-nourished in no acute distress.  Resting comfortably at home.  Head is normocephalic, atraumatic.  No labored breathing.  Speech is clear and coherent with logical content.  Patient is alert and oriented at baseline.  Bruising noted around the left eye between the eye and nasal bridge, upper eye id, and lower eye lod Left conjunctiva is completely erythematous   Assessment and Plan: 1. Acute bacterial sinusitis - amoxicillin-clavulanate (AUGMENTIN) 875-125 MG tablet; Take 1 tablet by mouth 2 (two) times daily.  Dispense: 14 tablet; Refill: 0  2. Subconjunctival hemorrhage of left eye  - Worsening symptoms that have not responded to OTC medications.  - Will give augmentin - Stop OTC medications as they are too drying. - Saline nasal rinses - Warm compress to eye - Saline eye drops - Stay well hydrated and get plenty of rest.  - Seek in person evaluation if no symptom improvement or if symptoms worsen.   Follow Up Instructions: I discussed the assessment and treatment plan with the patient. The patient was provided an opportunity to ask questions and all were answered. The patient agreed with the plan and demonstrated an understanding of the instructions.  A copy of instructions were sent to the patient via MyChart unless otherwise noted below.    The patient was advised to call back or seek an in-person evaluation if the symptoms worsen or if the condition fails to improve as anticipated.  Time:  I spent 15 minutes with the patient via telehealth technology discussing the above problems/concerns.    Margaretann Loveless, PA-C

## 2022-02-06 IMAGING — CT CT ANGIO HEAD
1 of 11 series · 5 of 33 positions shown · IV contrast (Omnipaque)
Comparison: None available.

CLINICAL DATA: Initial evaluation for acute headache.

EXAM:
CT ANGIOGRAPHY HEAD AND NECK
TECHNIQUE: Multidetector CT imaging of the head and neck was performed using
the standard protocol during bolus administration of intravenous
contrast. Multiplanar CT image reconstructions and MIPs were
obtained to evaluate the vascular anatomy. Carotid stenosis
measurements (when applicable) are obtained utilizing NASCET
criteria, using the distal internal carotid diameter as the
denominator.
CONTRAST:  100mL OMNIPAQUE IOHEXOL 350 MG/ML SOLN

[Series 11: axial thin · axial · 0.39mm/px · z∈[+723,+933]mm · 5 of 316 slices shown]
[im 53/316  soft-tissue]
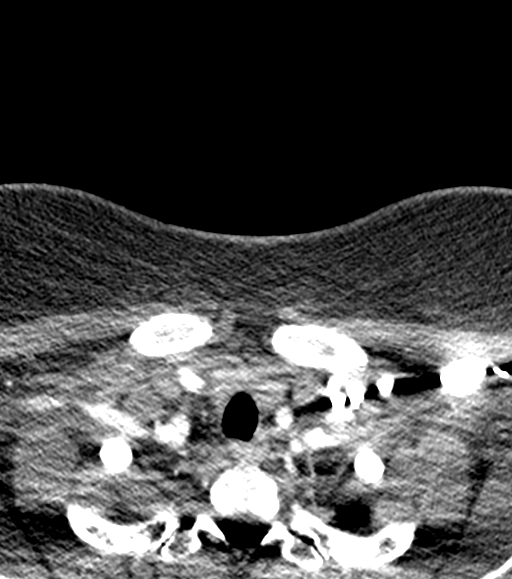
[im 106/316  bone]
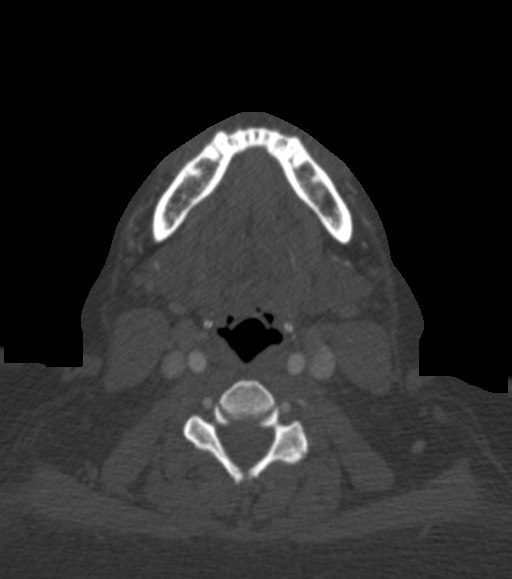
[im 158/316  soft-tissue]
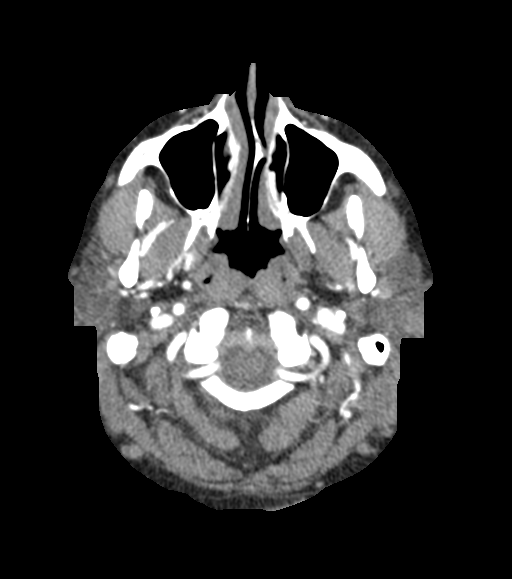
[im 211/316  bone]
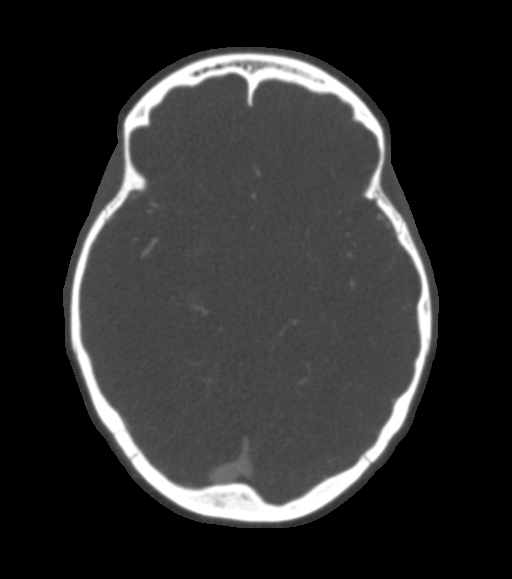
[im 263/316  soft-tissue]
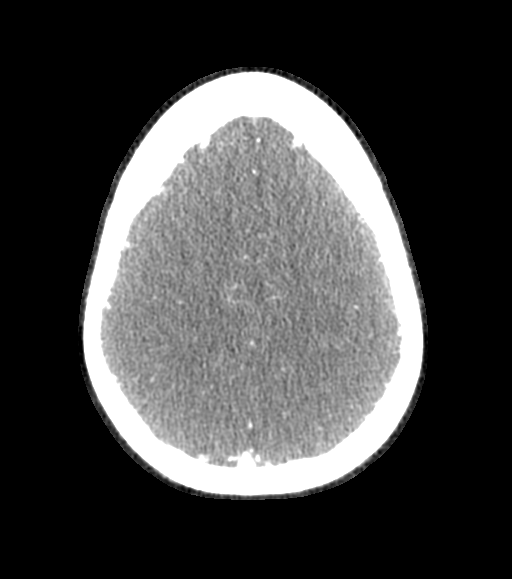

[5 of 33 positions shown; findings below may reference images not displayed]

FINDINGS: CT HEAD FINDINGS

Brain: Cerebral volume within normal limits for patient age.

No evidence for acute intracranial hemorrhage. No findings to
suggest acute large vessel territory infarct. No mass lesion,
midline shift, or mass effect. Ventricles are normal in size without
evidence for hydrocephalus. No extra-axial fluid collection
identified.

Vascular: No hyperdense vessel identified.

Skull: Scalp soft tissues demonstrate no acute abnormality.
Calvarium intact.

Sinuses/Orbits: Globes and orbital soft tissues within normal
limits.

Visualized paranasal sinuses are clear. Trace right mastoid effusion
noted, of doubtful significance.

CTA NECK FINDINGS

Aortic arch: Visualized aortic arch normal caliber with normal
branch pattern. No hemodynamically significant stenosis about the
origin of the great vessels. Visualized subclavian arteries widely
patent.

Right carotid system: Right CCA patent from its origin to the
bifurcation without stenosis. Mild soft plaque at the origin of the
right ICA with no more than mild 35% stenosis by NASCET criteria.
Right ICA widely patent distally without stenosis, dissection or
occlusion.

Left carotid system: Left common and internal carotid arteries
widely patent without stenosis, dissection or occlusion.

Vertebral arteries: Both vertebral arteries arise from the
subclavian arteries. No proximal subclavian artery stenosis. Both
vertebral arteries widely patent without stenosis, dissection or
occlusion.

Skeleton: No acute osseous finding. No discrete or worrisome osseous
lesions.

Other neck: No other acute soft tissue abnormality within the neck.
No mass or adenopathy.

Upper chest: Visualized upper chest demonstrates no acute finding.

Review of the MIP images confirms the above findings

CTA HEAD FINDINGS

Anterior circulation: Both internal carotid arteries patent to the
termini without stenosis or other abnormality. A1 segments widely
patent. Normal anterior communicating artery complex. Anterior
cerebral arteries patent to their distal aspects without stenosis.
No M1 stenosis or occlusion. Normal MCA bifurcations. Distal MCA
branches well perfused and symmetric.

Posterior circulation: Both V4 segments widely patent to the
vertebrobasilar junction without stenosis. Both PICA origins patent
and normal. Basilar patent to its distal aspect without stenosis.
Superior cerebellar arteries patent bilaterally. Left PCA supplied
via the basilar. Right PCA supplied via the basilar as well as a
prominent right posterior communicating artery. Both PCAs well
perfused to their distal aspects.

Venous sinuses: Grossly patent allowing for timing the contrast
bolus.

Anatomic variants: None significant.  No intracranial aneurysm.

Review of the MIP images confirms the above findings
IMPRESSION: 1. Normal CTA of the head and neck. No large vessel occlusion,
hemodynamically significant stenosis, or other acute vascular
abnormality. No aneurysm.
2. Otherwise negative head CT. No other acute intracranial
abnormality.

## 2023-01-06 ENCOUNTER — Inpatient Hospital Stay (HOSPITAL_COMMUNITY)
Admission: AD | Admit: 2023-01-06 | Discharge: 2023-01-06 | Disposition: A | Discharge status: Home / Self Care | Payer: 59 | Attending: Obstetrics & Gynecology | Admitting: Obstetrics & Gynecology

## 2023-01-06 ENCOUNTER — Inpatient Hospital Stay (HOSPITAL_COMMUNITY): Payer: 59

## 2023-01-06 ENCOUNTER — Encounter (HOSPITAL_COMMUNITY): Payer: Self-pay | Admitting: Obstetrics & Gynecology

## 2023-01-06 DIAGNOSIS — Z3A01 Less than 8 weeks gestation of pregnancy: Secondary | ICD-10-CM | POA: Diagnosis not present

## 2023-01-06 DIAGNOSIS — O26891 Other specified pregnancy related conditions, first trimester: Secondary | ICD-10-CM | POA: Insufficient documentation

## 2023-01-06 DIAGNOSIS — O209 Hemorrhage in early pregnancy, unspecified: Secondary | ICD-10-CM | POA: Diagnosis not present

## 2023-01-06 DIAGNOSIS — O09521 Supervision of elderly multigravida, first trimester: Secondary | ICD-10-CM | POA: Insufficient documentation

## 2023-01-06 DIAGNOSIS — R109 Unspecified abdominal pain: Secondary | ICD-10-CM

## 2023-01-06 DIAGNOSIS — Z3491 Encounter for supervision of normal pregnancy, unspecified, first trimester: Secondary | ICD-10-CM

## 2023-01-06 DIAGNOSIS — O26851 Spotting complicating pregnancy, first trimester: Secondary | ICD-10-CM | POA: Insufficient documentation

## 2023-01-06 LAB — URINALYSIS, ROUTINE W REFLEX MICROSCOPIC
Bilirubin Urine: NEGATIVE
Glucose, UA: NEGATIVE mg/dL
Hgb urine dipstick: NEGATIVE
Ketones, ur: NEGATIVE mg/dL
Leukocytes,Ua: NEGATIVE
Nitrite: NEGATIVE
Protein, ur: NEGATIVE mg/dL
Specific Gravity, Urine: 1.02 (ref 1.005–1.030)
pH: 6 (ref 5.0–8.0)

## 2023-01-06 LAB — WET PREP, GENITAL
Clue Cells Wet Prep HPF POC: NONE SEEN
Sperm: NONE SEEN
Trich, Wet Prep: NONE SEEN
WBC, Wet Prep HPF POC: <10 (ref <10)
Yeast Wet Prep HPF POC: NONE SEEN

## 2023-01-06 LAB — HCG, QUANTITATIVE, PREGNANCY: hCG, Beta Chain, Quant, S: 63369 m[IU]/mL — ABNORMAL HIGH (ref <5)

## 2023-01-06 LAB — CBC
HCT: 35.6 % — ABNORMAL LOW (ref 36.0–46.0)
Hemoglobin: 11.9 g/dL — ABNORMAL LOW (ref 12.0–15.0)
MCH: 29.6 pg (ref 26.0–34.0)
MCHC: 33.4 g/dL (ref 30.0–36.0)
MCV: 88.6 fL (ref 80.0–100.0)
Platelets: 316 10*3/uL (ref 150–400)
RBC: 4.02 MIL/uL (ref 3.87–5.11)
RDW: 13.2 % (ref 11.5–15.5)
WBC: 7.5 10*3/uL (ref 4.0–10.5)
nRBC: 0 % (ref 0.0–0.2)

## 2023-01-06 LAB — POCT PREGNANCY, URINE: Preg Test, Ur: POSITIVE — AB

## 2023-01-06 NOTE — MAU Provider Note (Signed)
History     CSN: [id number]  Arrival date and time: 01/06/23 0849   Event Date/Time   First Provider Initiated Contact with Patient 01/06/23 [id number]      Chief Complaint  Patient presents with   Vaginal Bleeding   Abdominal Pain   HPI  Angelica Fox is a 42 y.o. G3P1021 at Unknown gestational age who presents for evaluation of lower abdominal cramping. Patient reports cramping all along her lower abdomen since yesterday. She reports the pain comes and goes but is not severe. Patient rates the pain as a 3/10 and has not tried anything for the pain. She reports she also had one episode of spotting but none since then. She denies any discharge. Denies any constipation, diarrhea or any urinary complaints.   OB History     Gravida  4   Para  1   Term  1   Preterm      AB  2   Living  1      SAB  1   IAB  1   Ectopic      Multiple  0   Live Births  1           Past Medical History:  Diagnosis Date   Anemia    Anxiety    Depression    Headache    Hx of varicella     Past Surgical History:  Procedure Laterality Date   CESAREAN SECTION N/A 05/16/2015   Procedure: CESAREAN SECTION;  Surgeon: Candice Camp, MD;  Location: [facility];  Service: Obstetrics;  Laterality: N/A;    Family History  Problem Relation Age of Onset   Cancer Mother 84       breast   Heart disease Paternal Grandmother    Heart disease Paternal Grandfather    Cancer Other 34       breast cancer   Cancer Other 45       breast cancer    Social History   Tobacco Use   Smoking status: Never   Smokeless tobacco: Never  Substance Use Topics   Alcohol use: Yes   Drug use: No    Allergies: No Known Allergies  No medications prior to admission.    Review of Systems  Constitutional: Negative.  Negative for fatigue and fever.  HENT: Negative.    Respiratory: Negative.  Negative for shortness of breath.   Cardiovascular: Negative.  Negative for chest pain.  Gastrointestinal:   Positive for abdominal pain. Negative for constipation, diarrhea, nausea and vomiting.  Genitourinary:  Positive for vaginal bleeding. Negative for dysuria.  Neurological: Negative.  Negative for dizziness and headaches.   Physical Exam   Blood pressure 98/63, pulse 98, temperature 98.3 F (36.8 C), temperature source Oral, resp. rate 15, SpO2 100 %, unknown if currently breastfeeding.  Patient Vitals for the past 24 hrs:  BP Temp Temp src Pulse Resp SpO2  01/06/23 0904 98/63 98.3 F (36.8 C) Oral 98 15 100 %    Physical Exam Vitals and nursing note reviewed.  Constitutional:      General: She is not in acute distress.    Appearance: She is well-developed.  HENT:     Head: Normocephalic.  Eyes:     Pupils: Pupils are equal, round, and reactive to light.  Cardiovascular:     Rate and Rhythm: Normal rate and regular rhythm.     Heart sounds: Normal heart sounds.  Pulmonary:     Effort: Pulmonary effort is normal. No  respiratory distress.     Breath sounds: Normal breath sounds.  Abdominal:     General: Bowel sounds are normal. There is no distension.     Palpations: Abdomen is soft.     Tenderness: There is no abdominal tenderness.  Skin:    General: Skin is warm and dry.  Neurological:     Mental Status: She is alert and oriented to person, place, and time.  Psychiatric:        Mood and Affect: Mood normal.        Behavior: Behavior normal.        Thought Content: Thought content normal.        Judgment: Judgment normal.      MAU Course  Procedures  Results for orders placed or performed during the hospital encounter of 01/06/23 (from the past 24 hour(s))  Pregnancy, urine POC     Status: Abnormal   Collection Time: 01/06/23  9:10 AM  Result Value Ref Range   Preg Test, Ur POSITIVE (A) NEGATIVE  Urinalysis, Routine w reflex microscopic -Urine, Clean Catch     Status: Abnormal   Collection Time: 01/06/23  9:17 AM  Result Value Ref Range   Color, Urine YELLOW  YELLOW   APPearance HAZY (A) CLEAR   Specific Gravity, Urine 1.020 1.005 - 1.030   pH 6.0 5.0 - 8.0   Glucose, UA NEGATIVE NEGATIVE mg/dL   Hgb urine dipstick NEGATIVE NEGATIVE   Bilirubin Urine NEGATIVE NEGATIVE   Ketones, ur NEGATIVE NEGATIVE mg/dL   Protein, ur NEGATIVE NEGATIVE mg/dL   Nitrite NEGATIVE NEGATIVE   Leukocytes,Ua NEGATIVE NEGATIVE  Wet prep, genital     Status: None   Collection Time: 01/06/23  9:25 AM  Result Value Ref Range   Yeast Wet Prep HPF POC NONE SEEN NONE SEEN   Trich, Wet Prep NONE SEEN NONE SEEN   Clue Cells Wet Prep HPF POC NONE SEEN NONE SEEN   WBC, Wet Prep HPF POC <10 <10   Sperm NONE SEEN   CBC     Status: Abnormal   Collection Time: 01/06/23  9:31 AM  Result Value Ref Range   WBC 7.5 4.0 - 10.5 K/uL   RBC 4.02 3.87 - 5.11 MIL/uL   Hemoglobin 11.9 (L) 12.0 - 15.0 g/dL   HCT 95.6 (L) 21.3 - 08.6 %   MCV 88.6 80.0 - 100.0 fL   MCH 29.6 26.0 - 34.0 pg   MCHC 33.4 30.0 - 36.0 g/dL   RDW 57.8 46.9 - 62.9 %   Platelets 316 150 - 400 K/uL   nRBC 0.0 0.0 - 0.2 %  hCG, quantitative, pregnancy     Status: Abnormal   Collection Time: 01/06/23  9:31 AM  Result Value Ref Range   hCG, Beta Chain, Quant, S 63,369 (H) <5 mIU/mL     US OB LESS THAN 14 WEEKS WITH OB TRANSVAGINAL  Result Date: 01/06/2023 CLINICAL DATA:  Pregnant, abdominal pain and spotting EXAM: OBSTETRIC <14 WK ULTRASOUND TECHNIQUE: Transabdominal ultrasound was performed for evaluation of the gestation as well as the maternal uterus and adnexal regions. COMPARISON:  None Available. FINDINGS: Intrauterine gestational sac: Single Yolk sac:  Visualized. Embryo:  Visualized. Cardiac Activity: Visualized. Heart Rate: 113 bpm CRL:   11.8 mm   7 w 3 d                  [name] EDC: 08/22/2023 Subchorionic hemorrhage:  None visualized. Maternal uterus/adnexae: Limited visualization but otherwise unremarkable.  IMPRESSION: Single intrauterine gestation at sonographic gestational age of [redacted] weeks, 3 days.  Fetal heart rate 113 bpm. EDD 08/22/2023. Electronically Signed   By: Jearld Lesch M.D.   On: 01/06/2023 10:07     MDM Labs ordered and reviewed.   UA, UPT CBC, HCG ABO/Rh- O Neg Wet prep and gc/chlamydia US OB Comp Less 14 weeks with Transvaginal  CNM independently reviewed the imaging ordered. Imaging show live IUP at 7 weeks  Assessment and Plan   1. Normal intrauterine pregnancy on prenatal ultrasound in first trimester   2. [redacted] weeks gestation of pregnancy     -Discharge home in stable condition -First trimester precautions discussed -Patient advised to follow-up with OB to establish care -Patient may return to MAU as needed or if her condition were to change or worsen  Rolm Bookbinder, CNM 01/06/2023, 9:21 AM

## 2023-01-06 NOTE — Discharge Instructions (Signed)

## 2023-01-06 NOTE — MAU Note (Signed)
.  Angelica Fox is a 42 y.o. at Unknown here in MAU reporting: lower abdominal cramping for the last week or so, states she also started spotting x3 days ago. Denies any other abnormal vaginal discharge. +HPT  LMP: 11/18/22 Pain score: 7 Vitals:   01/06/23 0904  BP: 98/63  Pulse: 98  Resp: 15  Temp: 98.3 F (36.8 C)  SpO2: 100%      Lab orders placed from triage:  UPT

## 2023-01-07 LAB — GC/CHLAMYDIA PROBE AMP (~~LOC~~) NOT AT ARMC
Chlamydia: NEGATIVE
Comment: NEGATIVE
Comment: NORMAL
Neisseria Gonorrhea: NEGATIVE
